# Patient Record
Sex: Male | Born: 1985 | Race: Black or African American | Hispanic: No
Health system: Southern US, Community
[De-identification: ages and names within clinical notes are randomized; demographics above are authoritative.]

## PROBLEM LIST (undated history)

## (undated) DIAGNOSIS — J45909 Unspecified asthma, uncomplicated: Secondary | ICD-10-CM

---

## 2012-09-20 ENCOUNTER — Encounter (HOSPITAL_COMMUNITY): Payer: Self-pay

## 2012-09-20 ENCOUNTER — Emergency Department (HOSPITAL_COMMUNITY)
Admission: EM | Admit: 2012-09-20 | Discharge: 2012-09-20 | Disposition: A | Discharge status: Home / Self Care | Payer: Self-pay | Attending: Emergency Medicine | Admitting: Emergency Medicine

## 2012-09-20 DIAGNOSIS — Z23 Encounter for immunization: Secondary | ICD-10-CM | POA: Insufficient documentation

## 2012-09-20 DIAGNOSIS — Y9289 Other specified places as the place of occurrence of the external cause: Secondary | ICD-10-CM | POA: Insufficient documentation

## 2012-09-20 DIAGNOSIS — W260XXA Contact with knife, initial encounter: Secondary | ICD-10-CM | POA: Insufficient documentation

## 2012-09-20 DIAGNOSIS — S0181XA Laceration without foreign body of other part of head, initial encounter: Secondary | ICD-10-CM

## 2012-09-20 DIAGNOSIS — S01409A Unspecified open wound of unspecified cheek and temporomandibular area, initial encounter: Secondary | ICD-10-CM | POA: Insufficient documentation

## 2012-09-20 DIAGNOSIS — Y9389 Activity, other specified: Secondary | ICD-10-CM | POA: Insufficient documentation

## 2012-09-20 MED ORDER — TETANUS-DIPHTH-ACELL PERTUSSIS 5-2.5-18.5 LF-MCG/0.5 IM SUSP
0.5000 mL | Freq: Once | INTRAMUSCULAR | Status: AC
  Administered 2012-09-20: 0.5 mL via INTRAMUSCULAR
  Filled 2012-09-20: qty 0.5

## 2012-09-20 NOTE — ED Provider Notes (Signed)
History    This chart was scribed for Capricia Serda (PA) non-physician practitioner working with Hilario Quarry, MD by Sofie Rower, ED Scribe. This patient was seen in room WTR2/WLPT2 and the patient's care was started at 3:40Pm.   CSN: [id number]  Arrival date & time 09/20/12  1526   First MD Initiated Contact with Patient 09/20/12 1540      No chief complaint on file.   (Consider location/radiation/quality/duration/timing/severity/associated sxs/prior treatment) The history is provided by the patient. No language interpreter was used.    Jeremy Mccormick is a 27 y.o. male , with no known medical hx, with a chief complaint of facial laceration, who presents to the Emergency Department complaining of sudden, moderate, laceration located at the left cheek, onset today (09/20/12).  The pt reports he was performing repairs on his kitchen sink, where a nearby knife suddenly slid off the counter, cutting the left side of his face. The pt has applied a bandage dressing which controls the bleeding associated with the laceration. The pt denies experiencing any other injury or LOC.   The pt is unaware of the date of his last tetanus immunization.        No past medical history on file.  No past surgical history on file.  No family history on file.  History  Substance Use Topics  . Smoking status: Not on file  . Smokeless tobacco: Not on file  . Alcohol Use: Not on file      Review of Systems  Skin: Positive for wound.  All other systems reviewed and are negative.    Allergies  Review of patient's allergies indicates no known allergies.  Home Medications  No current outpatient prescriptions on file.  BP 165/94  Pulse 94  Temp(Src) 98.1 F (36.7 C) (Oral)  Resp 20  SpO2 100%  Physical Exam  Nursing note and vitals reviewed. Constitutional: He is oriented to person, place, and time. He appears well-developed and well-nourished. No distress.  HENT:  Head: Normocephalic  and atraumatic.    2 lacerations to left cheek. 3cm , 4cm. Gaping hemostatic. Normal oral mucosa. No lacerations within mouth.  Eyes: EOM are normal.  Neck: Neck supple. No tracheal deviation present.  Cardiovascular: Normal rate.   Pulmonary/Chest: Effort normal. No respiratory distress.  Musculoskeletal: Normal range of motion.  Neurological: He is alert and oriented to person, place, and time.  Skin: Skin is warm and dry. Laceration noted.  Psychiatric: He has a normal mood and affect. His behavior is normal.    ED Course  Procedures (including critical care time)  DIAGNOSTIC STUDIES: Oxygen Saturation is 100% on room air, normal by my interpretation.    COORDINATION OF CARE:  4:14 PM- Treatment plan concerning laceration repair discussed with patient. Pt agrees with treatment.  4:26 PM- Recheck. Treatment plan discussed with patient. Pt agrees with treatment.     LACERATION REPAIR PROCEDURE NOTE The patient's identification was confirmed and consent was obtained. This procedure was performed by Jaynie Crumble (PA) at 4:26 PM. Site: left cheek Sterile procedures observed Anesthetic used (type and amt): lidocaine 2.0 w/epi Suture type/size:prolene 6.0 Length:7cm total # of Sutures: 18 Technique:simple interrupted Complexitycomples Antibx ointment applied Tetanus ordered Site anesthetized, irrigated with NS, explored without evidence of foreign body, wound well approximated, site covered with dry, sterile dressing.  Patient tolerated procedure well without complications. Instructions for care discussed verbally and patient provided with additional written instructions for homecare and f/u.       Labs  Reviewed - No data to display No results found.   1. Laceration of face, initial encounter       MDM  Pt with laceration to the left face. Denies anyone else or himself injuring him. Does not feel unsafe at home. Tetanus updated.no signs of head trauma  otherwise.  Laceration repaired. Home with wound care and suture removal in 5-7 days.     I personally performed the services described in this documentation, which was scribed in my presence. The recorded information has been reviewed and is accurate.    Lottie Mussel, PA-C 09/21/12 [phone]

## 2012-09-20 NOTE — ED Notes (Signed)
He states that as he was affecting repairs in his kitchen, some sharp knives were displaced by his movements under a cabinet, causing them to fall; thereby lac. The left side of his face. He has 2 separate lac. There, neither of which are bleeding at present.

## 2012-09-22 NOTE — ED Provider Notes (Signed)
History/physical exam/procedure(s) were performed by non-physician practitioner and as supervising physician I was immediately available for consultation/collaboration. I have reviewed all notes and am in agreement with care and plan.   Danielys Madry S Rikita Grabert, MD 09/22/12 0110 

## 2013-12-20 ENCOUNTER — Emergency Department (HOSPITAL_COMMUNITY): Payer: Self-pay

## 2013-12-20 ENCOUNTER — Encounter (HOSPITAL_COMMUNITY): Payer: Self-pay | Admitting: Emergency Medicine

## 2013-12-20 ENCOUNTER — Emergency Department (HOSPITAL_COMMUNITY)
Admission: EM | Admit: 2013-12-20 | Discharge: 2013-12-21 | Disposition: A | Discharge status: Home / Self Care | Payer: Self-pay | Attending: Emergency Medicine | Admitting: Emergency Medicine

## 2013-12-20 DIAGNOSIS — R197 Diarrhea, unspecified: Secondary | ICD-10-CM | POA: Insufficient documentation

## 2013-12-20 DIAGNOSIS — R1110 Vomiting, unspecified: Secondary | ICD-10-CM | POA: Insufficient documentation

## 2013-12-20 DIAGNOSIS — J189 Pneumonia, unspecified organism: Secondary | ICD-10-CM

## 2013-12-20 DIAGNOSIS — J159 Unspecified bacterial pneumonia: Secondary | ICD-10-CM | POA: Insufficient documentation

## 2013-12-20 DIAGNOSIS — F17200 Nicotine dependence, unspecified, uncomplicated: Secondary | ICD-10-CM | POA: Insufficient documentation

## 2013-12-20 LAB — COMPREHENSIVE METABOLIC PANEL
ALT: 14 U/L (ref 0–53)
ANION GAP: 12 (ref 5–15)
AST: 25 U/L (ref 0–37)
Albumin: 4 g/dL (ref 3.5–5.2)
Alkaline Phosphatase: 52 U/L (ref 39–117)
BUN: 13 mg/dL (ref 6–23)
CALCIUM: 9.8 mg/dL (ref 8.4–10.5)
CO2: 23 mEq/L (ref 19–32)
CREATININE: 1.31 mg/dL (ref 0.50–1.35)
Chloride: 101 mEq/L (ref 96–112)
GFR calc non Af Amer: 74 mL/min — ABNORMAL LOW (ref >90)
GFR, EST AFRICAN AMERICAN: 86 mL/min — AB (ref >90)
GLUCOSE: 88 mg/dL (ref 70–99)
Potassium: 4.1 mEq/L (ref 3.7–5.3)
SODIUM: 136 meq/L — AB (ref 137–147)
TOTAL PROTEIN: 8.2 g/dL (ref 6.0–8.3)
Total Bilirubin: 1.1 mg/dL (ref 0.3–1.2)

## 2013-12-20 LAB — CBC WITH DIFFERENTIAL/PLATELET
Basophils Absolute: 0 10*3/uL (ref 0.0–0.1)
Basophils Relative: 0 % (ref 0–1)
Eosinophils Absolute: 0.1 10*3/uL (ref 0.0–0.7)
Eosinophils Relative: 1 % (ref 0–5)
HCT: 43.9 % (ref 39.0–52.0)
HEMOGLOBIN: 15 g/dL (ref 13.0–17.0)
LYMPHS ABS: 2.4 10*3/uL (ref 0.7–4.0)
LYMPHS PCT: 19 % (ref 12–46)
MCH: 31.1 pg (ref 26.0–34.0)
MCHC: 34.2 g/dL (ref 30.0–36.0)
MCV: 91.1 fL (ref 78.0–100.0)
MONOS PCT: 8 % (ref 3–12)
Monocytes Absolute: 0.9 10*3/uL (ref 0.1–1.0)
NEUTROS ABS: 9.1 10*3/uL — AB (ref 1.7–7.7)
NEUTROS PCT: 72 % (ref 43–77)
PLATELETS: 210 10*3/uL (ref 150–400)
RBC: 4.82 MIL/uL (ref 4.22–5.81)
RDW: 12.2 % (ref 11.5–15.5)
WBC: 12.5 10*3/uL — AB (ref 4.0–10.5)

## 2013-12-20 MED ORDER — METHYLPREDNISOLONE SODIUM SUCC 125 MG IJ SOLR
125.0000 mg | Freq: Once | INTRAMUSCULAR | Status: AC
  Administered 2013-12-20: 125 mg via INTRAVENOUS
  Filled 2013-12-20: qty 2

## 2013-12-20 MED ORDER — SODIUM CHLORIDE 0.9 % IV BOLUS (SEPSIS)
1000.0000 mL | Freq: Once | INTRAVENOUS | Status: AC
  Administered 2013-12-20: 1000 mL via INTRAVENOUS

## 2013-12-20 MED ORDER — ALBUTEROL SULFATE (2.5 MG/3ML) 0.083% IN NEBU
5.0000 mg | INHALATION_SOLUTION | Freq: Once | RESPIRATORY_TRACT | Status: AC
  Administered 2013-12-20: 5 mg via RESPIRATORY_TRACT
  Filled 2013-12-20: qty 6

## 2013-12-20 MED ORDER — PROMETHAZINE HCL 25 MG/ML IJ SOLN
25.0000 mg | Freq: Once | INTRAMUSCULAR | Status: AC
  Administered 2013-12-20: 25 mg via INTRAVENOUS
  Filled 2013-12-20: qty 1

## 2013-12-20 MED ORDER — AZITHROMYCIN 250 MG PO TABS
500.0000 mg | ORAL_TABLET | Freq: Once | ORAL | Status: AC
  Administered 2013-12-20: 500 mg via ORAL
  Filled 2013-12-20: qty 2

## 2013-12-20 MED ORDER — DEXTROSE 5 % IV SOLN
1.0000 g | Freq: Once | INTRAVENOUS | Status: AC
  Administered 2013-12-20: 1 g via INTRAVENOUS
  Filled 2013-12-20: qty 10

## 2013-12-20 NOTE — ED Notes (Signed)
Pt presents with c/o V/D and URI symptoms. Pt says that it is hard for him to take a deep breath and when he does he starts to cough and vomit. Pt also reporting chills.

## 2013-12-20 NOTE — ED Notes (Signed)
Family at bedside. Took blankets to room.

## 2013-12-20 NOTE — ED Provider Notes (Signed)
CSN: [id number]     Arrival date & time 12/20/13  1901 History   First MD Initiated Contact with Patient 12/20/13 1916     Chief Complaint  Patient presents with  . Emesis  . Diarrhea  . URI     (Consider location/radiation/quality/duration/timing/severity/associated sxs/prior Treatment) HPI 28 year old male who comes in today stating that he feels like he has fluid in his lungs. He states that he has had some cough but is nonproductive. He has had some episodes of feeling hot and sweaty. He has had some nausea with one episode of vomiting after coughing. He states that he has had an episode when he had fluid in his lungs before. He is unable to specify specifically what this will was or if there was a specific medical diagnosis attached to it. History reviewed. No pertinent past medical history. History reviewed. No pertinent past surgical history. No family history on file. History  Substance Use Topics  . Smoking status: Current Every Day Smoker -- 0.50 packs/day for 2 years  . Smokeless tobacco: Not on file  . Alcohol Use: Yes     Comment: rarely     Review of Systems  All other systems reviewed and are negative.     Allergies  Review of patient's allergies indicates no known allergies.  Home Medications   Prior to Admission medications   Medication Sig Start Date End Date Taking? Authorizing Provider  ibuprofen (ADVIL,MOTRIN) 200 MG tablet Take 400 mg by mouth every 6 (six) hours as needed for moderate pain.   Yes Historical Provider, MD   BP 139/90  Pulse 99  Temp(Src) 98.8 F (37.1 C) (Oral)  Resp 24  SpO2 98% Physical Exam  Nursing note and vitals reviewed. Constitutional: He is oriented to person, place, and time. He appears well-developed and well-nourished.  HENT:  Head: Normocephalic and atraumatic.  Right Ear: External ear normal.  Left Ear: External ear normal.  Nose: Nose normal.  Mouth/Throat: Oropharynx is clear and moist.  Eyes: Conjunctivae  and EOM are normal. Pupils are equal, round, and reactive to light.  Neck: Normal range of motion. Neck supple.  Cardiovascular: Normal rate, regular rhythm, normal heart sounds and intact distal pulses.   Pulmonary/Chest: Effort normal and breath sounds normal. No respiratory distress. He has no wheezes. He exhibits no tenderness.  Abdominal: Soft. Bowel sounds are normal. He exhibits no distension and no mass. There is no tenderness. There is no guarding.  Musculoskeletal: Normal range of motion.  Neurological: He is alert and oriented to person, place, and time. He has normal reflexes. He exhibits normal muscle tone. Coordination normal.  Skin: Skin is warm and dry.  Psychiatric: He has a normal mood and affect. His behavior is normal. Judgment and thought content normal.    ED Course  Procedures (including critical care time) Labs Review Labs Reviewed  COMPREHENSIVE METABOLIC PANEL - Abnormal; Notable for the following:    Sodium 136 (*)    GFR calc non Af Amer 74 (*)    GFR calc Af Amer 86 (*)    All other components within normal limits  CBC WITH DIFFERENTIAL  CBC WITH DIFFERENTIAL  RAPID HIV SCREEN [facility])    Imaging Review Dg Chest 2 View  12/20/2013   CLINICAL DATA:  Chest pain and dyspnea.  EXAM: CHEST  2 VIEW  COMPARISON:  None.  FINDINGS: There hazy bilateral pulmonary opacities which appear symmetric. No pneumothorax or pleural effusion is identified. Heart size is normal.  IMPRESSION: Hazy bilateral pulmonary opacities could be due to atypical infection or pneumonitis.   Electronically Signed   By: Drusilla Kannerhomas  Dalessio M.D.   On: 12/20/2013 20:04     EKG Interpretation None      MDM   Final diagnoses:  CAP (community acquired pneumonia)       Hilario Quarryanielle S Antolin Belsito, MD 12/21/13 [phone]

## 2013-12-21 LAB — RAPID HIV SCREEN (WH-MAU): SUDS RAPID HIV SCREEN: NONREACTIVE

## 2013-12-21 MED ORDER — AZITHROMYCIN 250 MG PO TABS: 250.0000 mg | ORAL_TABLET | Freq: Every day | ORAL | Status: DC

## 2013-12-21 MED ORDER — ALBUTEROL SULFATE HFA 108 (90 BASE) MCG/ACT IN AERS
2.0000 | INHALATION_SPRAY | RESPIRATORY_TRACT | Status: DC | PRN
  Administered 2013-12-21: 2 via RESPIRATORY_TRACT
  Filled 2013-12-21: qty 6.7

## 2013-12-21 NOTE — Discharge Instructions (Signed)

## 2014-12-20 ENCOUNTER — Encounter (HOSPITAL_COMMUNITY): Payer: Self-pay | Admitting: Emergency Medicine

## 2014-12-20 ENCOUNTER — Emergency Department (HOSPITAL_COMMUNITY): Payer: Self-pay

## 2014-12-20 ENCOUNTER — Emergency Department (HOSPITAL_COMMUNITY)
Admission: EM | Admit: 2014-12-20 | Discharge: 2014-12-20 | Disposition: A | Discharge status: Home / Self Care | Payer: Self-pay | Attending: Emergency Medicine | Admitting: Emergency Medicine

## 2014-12-20 DIAGNOSIS — J9801 Acute bronchospasm: Secondary | ICD-10-CM | POA: Insufficient documentation

## 2014-12-20 DIAGNOSIS — Z720 Tobacco use: Secondary | ICD-10-CM | POA: Insufficient documentation

## 2014-12-20 MED ORDER — ALBUTEROL SULFATE (2.5 MG/3ML) 0.083% IN NEBU
5.0000 mg | INHALATION_SOLUTION | Freq: Once | RESPIRATORY_TRACT | Status: AC
  Administered 2014-12-20: 5 mg via RESPIRATORY_TRACT
  Filled 2014-12-20: qty 6

## 2014-12-20 MED ORDER — IPRATROPIUM BROMIDE 0.02 % IN SOLN
0.5000 mg | Freq: Once | RESPIRATORY_TRACT | Status: AC
  Administered 2014-12-20: 0.5 mg via RESPIRATORY_TRACT
  Filled 2014-12-20: qty 2.5

## 2014-12-20 MED ORDER — AEROCHAMBER Z-STAT PLUS/MEDIUM MISC
1.0000 | Freq: Once | Status: AC
  Administered 2014-12-20: 1
  Filled 2014-12-20: qty 1

## 2014-12-20 MED ORDER — PREDNISONE 20 MG PO TABS: ORAL_TABLET | ORAL | Status: DC

## 2014-12-20 MED ORDER — PREDNISONE 20 MG PO TABS
60.0000 mg | ORAL_TABLET | Freq: Once | ORAL | Status: AC
Start: 2014-12-20 — End: 2014-12-20
  Administered 2014-12-20: 60 mg via ORAL
  Filled 2014-12-20: qty 3

## 2014-12-20 MED ORDER — ALBUTEROL SULFATE HFA 108 (90 BASE) MCG/ACT IN AERS
2.0000 | INHALATION_SPRAY | RESPIRATORY_TRACT | Status: DC | PRN
  Administered 2014-12-20: 2 via RESPIRATORY_TRACT
  Filled 2014-12-20: qty 6.7

## 2014-12-20 NOTE — ED Notes (Signed)
Bed: WA02 Expected date:  Expected time:  Means of arrival:  Comments: t2

## 2014-12-20 NOTE — ED Notes (Signed)
Pt from home c/o shortness of breath that started 2 weeks ago and got worse tonight. Wheezing all fields.

## 2014-12-20 NOTE — ED Notes (Signed)
MD Knapp at bedside 

## 2014-12-20 NOTE — Discharge Instructions (Signed)
Use the inhaler for wheezing or shortness of breath. Take the prednisone until gone. Recheck if you get a fever, your mucus gets yellow or green, or if you are still coughing in the next 10-14 days or if you are struggling to breathe despite using the inhaler. Talk to your land lord about getting rid of the roaches. You may need to put a plastic cover over your mattress, then put your sheets on top of that.    Asthma Attack Prevention Although there is no way to prevent asthma from starting, you can take steps to control the disease and reduce its symptoms. Learn about your asthma and how to control it. Take an active role to control your asthma by working with your health care provider to create and follow an asthma action plan. An asthma action plan guides you in:  Taking your medicines properly.  Avoiding things that set off your asthma or make your asthma worse (asthma triggers).  Tracking your level of asthma control.  Responding to worsening asthma.  Seeking emergency care when needed. To track your asthma, keep records of your symptoms, check your peak flow number using a handheld device that shows how well air moves out of your lungs (peak flow meter), and get regular asthma checkups.  WHAT ARE SOME WAYS TO PREVENT AN ASTHMA ATTACK?  Take medicines as directed by your health care provider.  Keep track of your asthma symptoms and level of control.  With your health care provider, write a detailed plan for taking medicines and managing an asthma attack. Then be sure to follow your action plan. Asthma is an ongoing condition that needs regular monitoring and treatment.  Identify and avoid asthma triggers. Many outdoor allergens and irritants (such as pollen, mold, cold air, and air pollution) can trigger asthma attacks. Find out what your asthma triggers are and take steps to avoid them.  Monitor your breathing. Learn to recognize warning signs of an attack, such as coughing, wheezing,  or shortness of breath. Your lung function may decrease before you notice any signs or symptoms, so regularly measure and record your peak airflow with a home peak flow meter.  Identify and treat attacks early. If you act quickly, you are less likely to have a severe attack. You will also need less medicine to control your symptoms. When your peak flow measurements decrease and alert you to an upcoming attack, take your medicine as instructed and immediately stop any activity that may have triggered the attack. If your symptoms do not improve, get medical help.  Pay attention to increasing quick-relief inhaler use. If you find yourself relying on your quick-relief inhaler, your asthma is not under control. See your health care provider about adjusting your treatment. WHAT CAN MAKE MY SYMPTOMS WORSE? A number of common things can set off or make your asthma symptoms worse and cause temporary increased inflammation of your airways. Keep track of your asthma symptoms for several weeks, detailing all the environmental and emotional factors that are linked with your asthma. When you have an asthma attack, go back to your asthma diary to see which factor, or combination of factors, might have contributed to it. Once you know what these factors are, you can take steps to control many of them. If you have allergies and asthma, it is important to take asthma prevention steps at home. Minimizing contact with the substance to which you are allergic will help prevent an asthma attack. Some triggers and ways to avoid these  triggers are: Animal Dander:  Some people are allergic to the flakes of skin or dried saliva from animals with fur or feathers.   There is no such thing as a hypoallergenic dog or cat breed. All dogs or cats can cause allergies, even if they don't shed.  Keep these pets out of your home.  If you are not able to keep a pet outdoors, keep the pet out of your bedroom and other sleeping areas at all  times, and keep the door closed.  Remove carpets and furniture covered with cloth from your home. If that is not possible, keep the pet away from fabric-covered furniture and carpets. Dust Mites: Many people with asthma are allergic to dust mites. Dust mites are tiny bugs that are found in every home in mattresses, pillows, carpets, fabric-covered furniture, bedcovers, clothes, stuffed toys, and other fabric-covered items.   Cover your mattress in a special dust-proof cover.  Cover your pillow in a special dust-proof cover, or wash the pillow each week in hot water. Water must be hotter than 130 F (54.4 C) to kill dust mites. Cold or warm water used with detergent and bleach can also be effective.  Wash the sheets and blankets on your bed each week in hot water.  Try not to sleep or lie on cloth-covered cushions.  Call ahead when traveling and ask for a smoke-free hotel room. Bring your own bedding and pillows in case the hotel only supplies feather pillows and down comforters, which may contain dust mites and cause asthma symptoms.  Remove carpets from your bedroom and those laid on concrete, if you can.  Keep stuffed toys out of the bed, or wash the toys weekly in hot water or cooler water with detergent and bleach. Cockroaches: Many people with asthma are allergic to the droppings and remains of cockroaches.   Keep food and garbage in closed containers. Never leave food out.  Use poison baits, traps, powders, gels, or paste (for example, boric acid).  If a spray is used to kill cockroaches, stay out of the room until the odor goes away. Indoor Mold:  Fix leaky faucets, pipes, or other sources of water that have mold around them.  Clean floors and moldy surfaces with a fungicide or diluted bleach.  Avoid using humidifiers, vaporizers, or swamp coolers. These can spread molds through the air. Pollen and Outdoor Mold:  When pollen or mold spore counts are high, try to keep your  windows closed.  Stay indoors with windows closed from late morning to afternoon. Pollen and some mold spore counts are highest at that time.  Ask your health care provider whether you need to take anti-inflammatory medicine or increase your dose of the medicine before your allergy season starts. Other Irritants to Avoid:  Tobacco smoke is an irritant. If you smoke, ask your health care provider how you can quit. Ask family members to quit smoking, too. Do not allow smoking in your home or car.  If possible, do not use a wood-burning stove, kerosene heater, or fireplace. Minimize exposure to all sources of smoke, including incense, candles, fires, and fireworks.  Try to stay away from strong odors and sprays, such as perfume, talcum powder, hair spray, and paints.  Decrease humidity in your home and use an indoor air cleaning device. Reduce indoor humidity to below 60%. Dehumidifiers or central air conditioners can do this.  Decrease house dust exposure by changing furnace and air cooler filters frequently.  Try to have someone  else vacuum for you once or twice a week. Stay out of rooms while they are being vacuumed and for a short while afterward.  If you vacuum, use a dust mask from a hardware store, a double-layered or microfilter vacuum cleaner bag, or a vacuum cleaner with a HEPA filter.  Sulfites in foods and beverages can be irritants. Do not drink beer or wine or eat dried fruit, processed potatoes, or shrimp if they cause asthma symptoms.  Cold air can trigger an asthma attack. Cover your nose and mouth with a scarf on cold or windy days.  Several health conditions can make asthma more difficult to manage, including a runny nose, sinus infections, reflux disease, psychological stress, and sleep apnea. Work with your health care provider to manage these conditions.  Avoid close contact with people who have a respiratory infection such as a cold or the flu, since your asthma  symptoms may get worse if you catch the infection. Wash your hands thoroughly after touching items that may have been handled by people with a respiratory infection.  Get a flu shot every year to protect against the flu virus, which often makes asthma worse for days or weeks. Also get a pneumonia shot if you have not previously had one. Unlike the flu shot, the pneumonia shot does not need to be given yearly. Medicines:  Talk to your health care provider about whether it is safe for you to take aspirin or non-steroidal anti-inflammatory medicines (NSAIDs). In a small number of people with asthma, aspirin and NSAIDs can cause asthma attacks. These medicines must be avoided by people who have known aspirin-sensitive asthma. It is important that people with aspirin-sensitive asthma read labels of all over-the-counter medicines used to treat pain, colds, coughs, and fever.  Beta-blockers and ACE inhibitors are other medicines you should discuss with your health care provider. HOW CAN I FIND OUT WHAT I AM ALLERGIC TO? Ask your asthma health care provider about allergy skin testing or blood testing (the RAST test) to identify the allergens to which you are sensitive. If you are found to have allergies, the most important thing to do is to try to avoid exposure to any allergens that you are sensitive to as much as possible. Other treatments for allergies, such as medicines and allergy shots (immunotherapy) are available.  CAN I EXERCISE? Follow your health care provider's advice regarding asthma treatment before exercising. It is important to maintain a regular exercise program, but vigorous exercise or exercise in cold, humid, or dry environments can cause asthma attacks, especially for those people who have exercise-induced asthma. Document Released: 04/11/2009 Document Revised: 04/28/2013 Document Reviewed: 10/29/2012 [facility] Patient Information 2015 [place], [place]. This information is not intended to  replace advice given to you by your health care provider. Make sure you discuss any questions you have with your health care provider.
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
## 2016-05-28 ENCOUNTER — Emergency Department (HOSPITAL_COMMUNITY): Payer: Self-pay

## 2016-05-28 ENCOUNTER — Emergency Department (HOSPITAL_COMMUNITY)
Admission: EM | Admit: 2016-05-28 | Discharge: 2016-05-28 | Disposition: A | Discharge status: Home / Self Care | Payer: Self-pay | Attending: Emergency Medicine | Admitting: Emergency Medicine

## 2016-05-28 ENCOUNTER — Encounter (HOSPITAL_COMMUNITY): Payer: Self-pay | Admitting: Emergency Medicine

## 2016-05-28 DIAGNOSIS — W010XXA Fall on same level from slipping, tripping and stumbling without subsequent striking against object, initial encounter: Secondary | ICD-10-CM | POA: Insufficient documentation

## 2016-05-28 DIAGNOSIS — Y929 Unspecified place or not applicable: Secondary | ICD-10-CM | POA: Insufficient documentation

## 2016-05-28 DIAGNOSIS — R0781 Pleurodynia: Secondary | ICD-10-CM

## 2016-05-28 DIAGNOSIS — S20312A Abrasion of left front wall of thorax, initial encounter: Secondary | ICD-10-CM | POA: Insufficient documentation

## 2016-05-28 DIAGNOSIS — Y939 Activity, unspecified: Secondary | ICD-10-CM | POA: Insufficient documentation

## 2016-05-28 DIAGNOSIS — F17200 Nicotine dependence, unspecified, uncomplicated: Secondary | ICD-10-CM | POA: Insufficient documentation

## 2016-05-28 DIAGNOSIS — W19XXXA Unspecified fall, initial encounter: Secondary | ICD-10-CM

## 2016-05-28 DIAGNOSIS — Y999 Unspecified external cause status: Secondary | ICD-10-CM | POA: Insufficient documentation

## 2016-05-28 DIAGNOSIS — Z79899 Other long term (current) drug therapy: Secondary | ICD-10-CM | POA: Insufficient documentation

## 2016-05-28 DIAGNOSIS — J45909 Unspecified asthma, uncomplicated: Secondary | ICD-10-CM | POA: Insufficient documentation

## 2016-05-28 HISTORY — DX: Unspecified asthma, uncomplicated: J45.909

## 2016-05-28 MED ORDER — NAPROXEN 500 MG PO TABS
500.0000 mg | ORAL_TABLET | Freq: Once | ORAL | Status: AC
  Administered 2016-05-28: 500 mg via ORAL
  Filled 2016-05-28: qty 1

## 2016-05-28 MED ORDER — NAPROXEN 500 MG PO TABS: 500.0000 mg | ORAL_TABLET | Freq: Two times a day (BID) | ORAL | 0 refills | Status: DC

## 2016-05-28 NOTE — ED Triage Notes (Signed)
Pt from home with complaints of left sided rib pain. Pt fell 2 weeks ago landing on a toy. Pt states he has rib pain from the fall causing it to be difficult for him to take deep breaths. Pt is able to maintain his oxygen saturation at 97%

## 2016-05-28 NOTE — ED Provider Notes (Signed)
[facility]-EMERGENCY DEPT Provider Note   CSN: [id number] Arrival date & time: 05/28/16  0110     History   Chief Complaint Chief Complaint  Patient presents with  . Fall  . Chest Pain    HPI Prescott ParmaMichael Cocke is a 31 y.o. male.  The history is provided by the patient and medical records.  Fall  Associated symptoms include chest pain.  Chest Pain      31 year old male here with left rib pain. Reports he tripped and fell 2 weeks ago and landed on one of his children's toys. States he had a small scrape over his left ribs which is healed up at this time, however he continues having pain. Pain is worse with cough, deep breathing, or movement. He denies any shortness of breath. History taking Tylenol at home for pain without relief.  Past Medical History:  Diagnosis Date  . Asthma     There are no active problems to display for this patient.   History reviewed. No pertinent surgical history.     Home Medications    Prior to Admission medications   Medication Sig Start Date End Date Taking? Authorizing Provider  acetaminophen-codeine (TYLENOL #3) 300-30 MG per tablet Take 1 tablet by mouth once.    Historical Provider, MD  azithromycin (ZITHROMAX Z-PAK) 250 MG tablet Take 1 tablet (250 mg total) by mouth daily. Patient not taking: Reported on 12/20/2014 12/24/13   Margarita Grizzleanielle Ray, MD  naproxen sodium (ANAPROX) 220 MG tablet Take 220 mg by mouth 2 (two) times daily as needed (pain).    Historical Provider, MD  predniSONE (DELTASONE) 20 MG tablet Take 3 po QD x 3d , then 2 po QD x 3d then 1 po QD x 3d 12/20/14   Devoria AlbeIva Knapp, MD    Family History No family history on file.  Social History Social History  Substance Use Topics  . Smoking status: Current Every Day Smoker    Packs/day: 0.50    Years: 2.00  . Smokeless tobacco: Never Used  . Alcohol use Yes     Comment: rarely      Allergies   Patient has no known allergies.   Review of Systems Review of Systems    Cardiovascular: Positive for chest pain.  All other systems reviewed and are negative.    Physical Exam Updated Vital Signs BP 146/92 (BP Location: Left Arm)   Pulse 80   Temp 98.8 F (37.1 C) (Oral)   Resp 16   SpO2 97%   Physical Exam  Constitutional: He is oriented to person, place, and time. He appears well-developed and well-nourished.  HENT:  Head: Normocephalic and atraumatic.  Mouth/Throat: Oropharynx is clear and moist.  Eyes: Conjunctivae and EOM are normal. Pupils are equal, round, and reactive to light.  Neck: Normal range of motion.  Cardiovascular: Normal rate, regular rhythm and normal heart sounds.   Pulmonary/Chest: Effort normal and breath sounds normal. No respiratory distress. He has no wheezes.  Abdominal: Soft. Bowel sounds are normal. There is no tenderness. There is no rebound.  Evidence of old appearing abrasion to left lower lateral ribs; no bruising or gross deformity  Musculoskeletal: Normal range of motion.  Neurological: He is alert and oriented to person, place, and time.  Skin: Skin is warm and dry.  Psychiatric: He has a normal mood and affect.  Nursing note and vitals reviewed.    ED Treatments / Results  Labs (all labs ordered are listed, but only abnormal results are displayed)  Labs Reviewed - No data to display  EKG  EKG Interpretation None       Radiology Dg Ribs Unilateral W/chest Left  Result Date: 05/28/2016 CLINICAL DATA:  Left rib pain.  Patient fell 2 weeks ago. EXAM: LEFT RIBS AND CHEST - 3+ VIEW COMPARISON:  12/20/2014 CXR FINDINGS: No fracture or other bone lesions are seen involving the ribs. A BB was placed over the area pain which projects adjacent to the anterior left eleventh rib. No acute abnormality is seen. There is no evidence of pneumothorax or pleural effusion. Both lungs are clear. Heart size and mediastinal contours are within normal limits. IMPRESSION: No acute displaced rib fracture. Electronically Signed    By: Tollie Eth M.D.   On: 05/28/2016 02:30    Procedures Procedures (including critical care time)  Medications Ordered in ED Medications - No data to display   Initial Impression / Assessment and Plan / ED Course  I have reviewed the triage vital signs and the nursing notes.  Pertinent labs & imaging results that were available during my care of the patient were reviewed by me and considered in my medical decision making (see chart for details).  31 year old male here with left rib pain after fall on toy 2 weeks ago.  No acute deformities noted on exam, evidence of old abrasion.  Lungs clear, no respiratory distress.  Rib films without acute findings.  Will plan to discharge home with supportive care.  Discussed plan with patient, he acknowledged understanding and agreed with plan of care.  Return precautions given for new or worsening symptoms.  Final Clinical Impressions(s) / ED Diagnoses   Final diagnoses:  Fall, initial encounter  Rib pain on left side    New Prescriptions New Prescriptions   NAPROXEN (NAPROSYN) 500 MG TABLET    Take 1 tablet (500 mg total) by mouth 2 (two) times daily with a meal.     Garlon Hatchet, PA-C 05/28/16 0840    Doug Sou, MD 05/28/16 1750

## 2016-05-28 NOTE — Discharge Instructions (Signed)
Take medications as directed for pain. Follow-up with your primary care doctor. Return to the ED for new or worsening symptoms.

## 2016-07-08 ENCOUNTER — Emergency Department (HOSPITAL_COMMUNITY)
Admission: EM | Admit: 2016-07-08 | Discharge: 2016-07-08 | Disposition: A | Discharge status: Home / Self Care | Payer: Self-pay | Attending: Emergency Medicine | Admitting: Emergency Medicine

## 2016-07-08 ENCOUNTER — Emergency Department (HOSPITAL_COMMUNITY): Payer: Self-pay

## 2016-07-08 ENCOUNTER — Encounter (HOSPITAL_COMMUNITY): Payer: Self-pay | Admitting: Emergency Medicine

## 2016-07-08 DIAGNOSIS — F1490 Cocaine use, unspecified, uncomplicated: Secondary | ICD-10-CM | POA: Insufficient documentation

## 2016-07-08 DIAGNOSIS — Z87891 Personal history of nicotine dependence: Secondary | ICD-10-CM | POA: Insufficient documentation

## 2016-07-08 DIAGNOSIS — R0789 Other chest pain: Secondary | ICD-10-CM | POA: Insufficient documentation

## 2016-07-08 DIAGNOSIS — J4521 Mild intermittent asthma with (acute) exacerbation: Secondary | ICD-10-CM | POA: Insufficient documentation

## 2016-07-08 LAB — CBC
HCT: 41.5 % (ref 39.0–52.0)
Hemoglobin: 14.6 g/dL (ref 13.0–17.0)
MCH: 31.5 pg (ref 26.0–34.0)
MCHC: 35.2 g/dL (ref 30.0–36.0)
MCV: 89.6 fL (ref 78.0–100.0)
Platelets: 309 10*3/uL (ref 150–400)
RBC: 4.63 MIL/uL (ref 4.22–5.81)
RDW: 12.6 % (ref 11.5–15.5)
WBC: 11.1 10*3/uL — AB (ref 4.0–10.5)

## 2016-07-08 LAB — BASIC METABOLIC PANEL
Anion gap: 11 (ref 5–15)
BUN: 10 mg/dL (ref 6–20)
CHLORIDE: 109 mmol/L (ref 101–111)
CO2: 21 mmol/L — ABNORMAL LOW (ref 22–32)
Calcium: 9.4 mg/dL (ref 8.9–10.3)
Creatinine, Ser: 1.09 mg/dL (ref 0.61–1.24)
GFR calc Af Amer: >60 mL/min (ref >60)
GFR calc non Af Amer: >60 mL/min (ref >60)
Glucose, Bld: 79 mg/dL (ref 65–99)
POTASSIUM: 3.6 mmol/L (ref 3.5–5.1)
SODIUM: 141 mmol/L (ref 135–145)

## 2016-07-08 LAB — I-STAT TROPONIN, ED
TROPONIN I, POC: 0.15 ng/mL — AB (ref 0.00–0.08)
Troponin i, poc: 0.2 ng/mL (ref 0.00–0.08)

## 2016-07-08 MED ORDER — ALBUTEROL SULFATE (2.5 MG/3ML) 0.083% IN NEBU
5.0000 mg | INHALATION_SOLUTION | Freq: Once | RESPIRATORY_TRACT | Status: AC
  Administered 2016-07-08: 5 mg via RESPIRATORY_TRACT
  Filled 2016-07-08: qty 6

## 2016-07-08 MED ORDER — ALBUTEROL SULFATE HFA 108 (90 BASE) MCG/ACT IN AERS
2.0000 | INHALATION_SPRAY | Freq: Once | RESPIRATORY_TRACT | Status: AC
  Administered 2016-07-08: 2 via RESPIRATORY_TRACT
  Filled 2016-07-08: qty 6.7

## 2016-07-08 NOTE — ED Triage Notes (Signed)
Patient is complaining of shortness of breath. Hx of asthma

## 2016-07-08 NOTE — ED Notes (Signed)
Discharge instructions and follow up care reviewed with patient. Patient verbalized understanding. 

## 2016-07-08 NOTE — ED Notes (Signed)
ED Provider at bedside. 

## 2016-07-08 NOTE — ED Notes (Signed)
Patient transported to X-ray 

## 2016-07-08 NOTE — ED Notes (Signed)
I stat trop. Given to MD and RN.

## 2016-07-08 NOTE — ED Provider Notes (Signed)
[facility]-EMERGENCY DEPT Provider Note   CSN: [id number] Arrival date & time: 07/08/16  0905     History   Chief Complaint Chief Complaint  Patient presents with  . Shortness of Breath  . Chest Pain    HPI Jeremy Mccormick is a 31 y.o. male.  The history is provided by the patient.  Shortness of Breath  This is a recurrent problem. The average episode lasts 3 hours. The problem occurs continuously.The problem has been resolved (following initial breathing treatment from triage). Associated symptoms include rhinorrhea, cough (2 days) and chest pain. Pertinent negatives include no leg swelling. He has tried nothing for the symptoms. The treatment provided no relief. Associated medical issues include asthma. Associated medical issues do not include PE or past MI.  Chest Pain   This is a recurrent (consistent with Asthma attacks) problem. The current episode started 1 to 2 hours ago. The problem occurs constantly. The problem has been resolved (following initial breathing treatment from triage). The pain is present in the substernal region. The quality of the pain is described as pressure-like. The pain does not radiate. Associated symptoms include cough (2 days) and shortness of breath.  Pertinent negatives for past medical history include no COPD, no diabetes, no hyperlipidemia, no hypertension, no MI and no PE.    Past Medical History:  Diagnosis Date  . Asthma     There are no active problems to display for this patient.   History reviewed. No pertinent surgical history.     Home Medications    Prior to Admission medications   Medication Sig Start Date End Date Taking? Authorizing Provider  albuterol (PROVENTIL HFA;VENTOLIN HFA) 108 (90 Base) MCG/ACT inhaler Inhale 1-2 puffs into the lungs every 6 (six) hours as needed for wheezing or shortness of breath.   Yes Historical Provider, MD  diphenhydrAMINE (BENADRYL) 25 MG tablet Take 25 mg by mouth every 6 (six) hours as needed for  allergies.   Yes Historical Provider, MD  azithromycin (ZITHROMAX Z-PAK) 250 MG tablet Take 1 tablet (250 mg total) by mouth daily. Patient not taking: Reported on 07/08/2016 12/24/13   [name], MD  naproxen (NAPROSYN) 500 MG tablet Take 1 tablet (500 mg total) by mouth 2 (two) times daily with a meal. Patient not taking: Reported on 07/08/2016 05/28/16   Garlon Hatchet, PA-C  predniSONE (DELTASONE) 20 MG tablet Take 3 po QD x 3d , then 2 po QD x 3d then 1 po QD x 3d Patient not taking: Reported on 07/08/2016 12/20/14   Devoria Albe, MD    Family History No family history on file.  Social History Social History  Substance Use Topics  . Smoking status: Former Smoker    Packs/day: 0.50    Years: 2.00  . Smokeless tobacco: Never Used  . Alcohol use Yes     Comment: rarely      Allergies   Patient has no known allergies.   Review of Systems Review of Systems  HENT: Positive for rhinorrhea.   Respiratory: Positive for cough (2 days) and shortness of breath.   Cardiovascular: Positive for chest pain. Negative for leg swelling.  Ten systems are reviewed and are negative for acute change except as noted in the HPI    Physical Exam Updated Vital Signs BP 130/82 (BP Location: Left Arm)   Pulse 114   Temp 98 F (36.7 C) (Axillary)   Resp (!) 30   Ht 5\' 7"  (1.702 m)   Wt 140 lb (  63.5 kg)   SpO2 98%   BMI 21.93 kg/m   Physical Exam  Constitutional: He is oriented to person, place, and time. He appears well-developed and well-nourished. No distress.  HENT:  Head: Normocephalic and atraumatic.  Nose: Nose normal.  Eyes: Conjunctivae and EOM are normal. Pupils are equal, round, and reactive to light. Right eye exhibits no discharge. Left eye exhibits no discharge. No scleral icterus.  Neck: Normal range of motion. Neck supple.  Cardiovascular: Normal rate and regular rhythm.  Exam reveals no gallop and no friction rub.   No murmur heard. Pulmonary/Chest: Effort normal and breath  sounds normal. No stridor. No respiratory distress. He has no rales.  Abdominal: Soft. He exhibits no distension. There is no tenderness.  Musculoskeletal: He exhibits no edema or tenderness.  Neurological: He is alert and oriented to person, place, and time.  Skin: Skin is warm and dry. No rash noted. He is not diaphoretic. No erythema.  Psychiatric: He has a normal mood and affect.  Vitals reviewed.    ED Treatments / Results  Labs (all labs ordered are listed, but only abnormal results are displayed) Labs Reviewed  BASIC METABOLIC PANEL - Abnormal; Notable for the following:       Result Value   CO2 21 (*)    All other components within normal limits  CBC - Abnormal; Notable for the following:    WBC 11.1 (*)    All other components within normal limits  I-STAT TROPOININ, ED - Abnormal; Notable for the following:    Troponin i, poc 0.20 (*)    All other components within normal limits  I-STAT TROPOININ, ED - Abnormal; Notable for the following:    Troponin i, poc 0.15 (*)    All other components within normal limits    EKG  EKG Interpretation  Date/Time:  Sunday July 08 2016 09:38:10 EST Ventricular Rate:  99 PR Interval:    QRS Duration: 87 QT Interval:  330 QTC Calculation: 424 R Axis:   90 Text Interpretation:  Sinus rhythm Probable left atrial enlargement Borderline right axis deviation ST elev, probable normal early repol pattern Confirmed by [facility] MD, Elainah Rhyne ([id number]) on 07/08/2016 11:12:20 AM       Radiology Dg Chest 2 View  Result Date: 07/08/2016 CLINICAL DATA:  Shortness of breath. EXAM: CHEST  2 VIEW COMPARISON:  Radiographs of May 28, 2016. FINDINGS: The heart size and mediastinal contours are within normal limits. Both lungs are clear. No pneumothorax or pleural effusion is noted. The visualized skeletal structures are unremarkable. IMPRESSION: No active cardiopulmonary disease. Electronically Signed   By: Lupita Raider, M.D.   On: 07/08/2016 10:23     Procedures Procedures (including critical care time)  Medications Ordered in ED Medications  albuterol (PROVENTIL) (2.5 MG/3ML) 0.083% nebulizer solution 5 mg (5 mg Nebulization Given 07/08/16 0931)  albuterol (PROVENTIL HFA;VENTOLIN HFA) 108 (90 Base) MCG/ACT inhaler 2 puff (2 puffs Inhalation Given 07/08/16 1258)     Initial Impression / Assessment and Plan / ED Course  I have reviewed the triage vital signs and the nursing notes.  Pertinent labs & imaging results that were available during my care of the patient were reviewed by me and considered in my medical decision making (see chart for details).     Asthma exacerbation. Improved with single breathing treatment. Chest pain associated with asthma. Low suspicion for ACS. EKG with STE due to repol changes. Currently pain free following breathing treatment. Triage labs with elevated  trop. Pt reported cocaine use. Could be related to this. Low pretest prob for PE. BP not significantly elevated. Equal radial pulses. Chest x-ray without evidence suggestive of pneumonia, pneumothorax, pneumomediastinum.  No abnormal contour of the mediastinum to suggest dissection. No evidence of acute injuries. Low suspicion for dissection. Not classic for esophageal perforation.  Will repeat trop to ensure down trend.   Trop at 3 hour trending down. Has remained HDS throughout stay. The patient is safe for discharge with strict return precautions.    Final Clinical Impressions(s) / ED Diagnoses   Final diagnoses:  Mild intermittent asthma with exacerbation  Atypical chest pain  Cocaine use   Disposition: Discharge  Condition: Good  I have discussed the results, Dx and Tx plan with the patient who expressed understanding and agree(s) with the plan. Discharge instructions discussed at great length. The patient was given strict return precautions who verbalized understanding of the instructions. No further questions at time of discharge.    New  Prescriptions   No medications on file    Follow Up: primary care provider         Nira ConnPedro Eduardo Kesleigh Morson, MD 07/08/16 1312

## 2016-10-27 ENCOUNTER — Emergency Department (HOSPITAL_COMMUNITY)
Admission: EM | Admit: 2016-10-27 | Discharge: 2016-10-27 | Disposition: A | Discharge status: Home / Self Care | Payer: Self-pay | Attending: Emergency Medicine | Admitting: Emergency Medicine

## 2016-10-27 ENCOUNTER — Emergency Department (HOSPITAL_COMMUNITY): Payer: Self-pay

## 2016-10-27 ENCOUNTER — Encounter (HOSPITAL_COMMUNITY): Payer: Self-pay | Admitting: Nurse Practitioner

## 2016-10-27 DIAGNOSIS — J4541 Moderate persistent asthma with (acute) exacerbation: Secondary | ICD-10-CM | POA: Insufficient documentation

## 2016-10-27 DIAGNOSIS — Z79899 Other long term (current) drug therapy: Secondary | ICD-10-CM | POA: Insufficient documentation

## 2016-10-27 DIAGNOSIS — Z87891 Personal history of nicotine dependence: Secondary | ICD-10-CM | POA: Insufficient documentation

## 2016-10-27 MED ORDER — IPRATROPIUM BROMIDE 0.02 % IN SOLN
0.5000 mg | Freq: Once | RESPIRATORY_TRACT | Status: AC
  Administered 2016-10-27: 0.5 mg via RESPIRATORY_TRACT
  Filled 2016-10-27: qty 2.5

## 2016-10-27 MED ORDER — ALBUTEROL (5 MG/ML) CONTINUOUS INHALATION SOLN
10.0000 mg/h | INHALATION_SOLUTION | RESPIRATORY_TRACT | Status: DC
  Administered 2016-10-27: 10 mg/h via RESPIRATORY_TRACT
  Filled 2016-10-27: qty 20

## 2016-10-27 MED ORDER — PREDNISONE 20 MG PO TABS: 40.0000 mg | ORAL_TABLET | Freq: Every day | ORAL | 0 refills | Status: DC

## 2016-10-27 MED ORDER — ALBUTEROL SULFATE (2.5 MG/3ML) 0.083% IN NEBU
5.0000 mg | INHALATION_SOLUTION | Freq: Once | RESPIRATORY_TRACT | Status: AC
  Administered 2016-10-27: 5 mg via RESPIRATORY_TRACT
  Filled 2016-10-27: qty 6

## 2016-10-27 MED ORDER — ALBUTEROL SULFATE HFA 108 (90 BASE) MCG/ACT IN AERS
2.0000 | INHALATION_SPRAY | Freq: Once | RESPIRATORY_TRACT | Status: AC
Start: 2016-10-27 — End: 2016-10-27
  Administered 2016-10-27: 2 via RESPIRATORY_TRACT
  Filled 2016-10-27: qty 6.7

## 2016-10-27 MED ORDER — ACETAMINOPHEN 325 MG PO TABS
650.0000 mg | ORAL_TABLET | Freq: Once | ORAL | Status: AC
  Administered 2016-10-27: 650 mg via ORAL
  Filled 2016-10-27: qty 2

## 2016-10-27 MED ORDER — ALBUTEROL SULFATE HFA 108 (90 BASE) MCG/ACT IN AERS: 1.0000 | INHALATION_SPRAY | Freq: Four times a day (QID) | RESPIRATORY_TRACT | 0 refills | Status: DC | PRN

## 2016-10-27 MED ORDER — PREDNISONE 20 MG PO TABS
60.0000 mg | ORAL_TABLET | Freq: Once | ORAL | Status: AC
  Administered 2016-10-27: 60 mg via ORAL
  Filled 2016-10-27: qty 3

## 2016-10-27 NOTE — ED Notes (Signed)
RT paged.

## 2016-10-27 NOTE — ED Provider Notes (Signed)
[facility]-EMERGENCY DEPT Provider Note   CSN: [id number] Arrival date & time: 10/27/16  4098     History   Chief Complaint Chief Complaint  Patient presents with  . Asthma  . Shortness of Breath  . Wheezing    HPI Jeremy Mccormick is a 31 y.o. male.  Jeremy Mccormick is a 31 y.o. Male presents to the emergency department complaining of an asthma exacerbation beginning earlier this morning. Patient reports he ran out of his albuterol inhaler yesterday. He reports he typically uses his albuterol inhaler every other day unless he has exacerbation. He reports he was looking for his inhaler today because he felt like he was having increased wheezing and shortness of breath. He cannot find his inhaler and reports chest tightness and wheezing today. He denies fevers. He denies other complaints.   The history is provided by the patient and medical records. No language interpreter was used.  Asthma  Associated symptoms include shortness of breath. Pertinent negatives include no chest pain, no abdominal pain and no headaches.  Shortness of Breath  Associated symptoms include cough and wheezing. Pertinent negatives include no fever, no headaches, no sore throat, no neck pain, no chest pain, no vomiting, no abdominal pain and no rash. Associated medical issues include asthma.  Wheezing   Associated symptoms include cough. Pertinent negatives include no chest pain, no fever, no abdominal pain, no vomiting, no dysuria, no headaches, no sore throat, no neck pain and no rash. His past medical history is significant for asthma.    Past Medical History:  Diagnosis Date  . Asthma   . Asthma     There are no active problems to display for this patient.   History reviewed. No pertinent surgical history.     Home Medications    Prior to Admission medications   Medication Sig Start Date End Date Taking? Authorizing Provider  albuterol (PROVENTIL HFA;VENTOLIN HFA) 108 (90 Base) MCG/ACT inhaler Inhale  1-2 puffs into the lungs every 6 (six) hours as needed for wheezing or shortness of breath. 10/27/16   Everlene Farrier, PA-C  predniSONE (DELTASONE) 20 MG tablet Take 2 tablets (40 mg total) by mouth daily. 10/27/16   Everlene Farrier, PA-C    Family History No family history on file.  Social History Social History  Substance Use Topics  . Smoking status: Former Smoker    Packs/day: 0.50    Years: 2.00  . Smokeless tobacco: Never Used  . Alcohol use Yes     Comment: rarely      Allergies   Patient has no known allergies.   Review of Systems Review of Systems  Constitutional: Negative for chills and fever.  HENT: Negative for congestion and sore throat.   Eyes: Negative for visual disturbance.  Respiratory: Positive for cough, chest tightness, shortness of breath and wheezing.   Cardiovascular: Negative for chest pain.  Gastrointestinal: Negative for abdominal pain, nausea and vomiting.  Genitourinary: Negative for dysuria.  Musculoskeletal: Negative for neck pain.  Skin: Negative for rash.  Neurological: Negative for light-headedness and headaches.     Physical Exam Updated Vital Signs BP (!) 166/74 (BP Location: Left Arm)   Pulse (!) 111   Temp 98 F (36.7 C) (Oral)   Resp 18   Ht 5\' 7"  (1.702 m)   Wt 63.5 kg (140 lb)   SpO2 100%   BMI 21.93 kg/m   Physical Exam  Constitutional: He is oriented to person, place, and time. He appears well-developed and well-nourished. No  distress.  Nontoxic appearing.  HENT:  Head: Normocephalic and atraumatic.  Mouth/Throat: Oropharynx is clear and moist.  Eyes: Conjunctivae are normal. Pupils are equal, round, and reactive to light. Right eye exhibits no discharge. Left eye exhibits no discharge.  Neck: Neck supple. No JVD present.  Cardiovascular: Normal rate, regular rhythm, normal heart sounds and intact distal pulses.  Exam reveals no gallop and no friction rub.   No murmur heard. Heart rate is 96 on exam.    Pulmonary/Chest: Effort normal. No stridor. No respiratory distress. He has wheezes. He has no rales.  Inspiratory and expiratory wheezes noted bilaterally. No rales or rhonchi. No increased work of breathing. Oxygen saturation is 96% on room air. Speaking in complete sentences.  Abdominal: Soft. There is no tenderness.  Musculoskeletal: He exhibits no edema.  Lymphadenopathy:    He has no cervical adenopathy.  Neurological: He is alert and oriented to person, place, and time. Coordination normal.  Skin: Skin is warm and dry. Capillary refill takes less than 2 seconds. No rash noted. He is not diaphoretic. No erythema. No pallor.  Psychiatric: He has a normal mood and affect. His behavior is normal.  Nursing note and vitals reviewed.    ED Treatments / Results  Labs (all labs ordered are listed, but only abnormal results are displayed) Labs Reviewed - No data to display  EKG  ECG interpretation   Date: 10/27/2016  Rate: 93  Rhythm: normal sinus rhythm  QRS Axis: normal  Intervals: normal  ST/T Wave abnormalities: normal  Conduction Disutrbances: none          Radiology No results found.  Procedures Procedures (including critical care time)  Medications Ordered in ED Medications  albuterol (PROVENTIL,VENTOLIN) solution continuous neb (0 mg/hr Nebulization Stopped 10/27/16 0950)  albuterol (PROVENTIL HFA;VENTOLIN HFA) 108 (90 Base) MCG/ACT inhaler 2 puff (not administered)  albuterol (PROVENTIL) (2.5 MG/3ML) 0.083% nebulizer solution 5 mg (5 mg Nebulization Given 10/27/16 0622)  albuterol (PROVENTIL) (2.5 MG/3ML) 0.083% nebulizer solution 5 mg (5 mg Nebulization Given 10/27/16 0711)  ipratropium (ATROVENT) nebulizer solution 0.5 mg (0.5 mg Nebulization Given 10/27/16 0711)  predniSONE (DELTASONE) tablet 60 mg (60 mg Oral Given 10/27/16 0646)  acetaminophen (TYLENOL) tablet 650 mg (650 mg Oral Given 10/27/16 0646)  ipratropium (ATROVENT) nebulizer solution 0.5 mg (0.5 mg  Nebulization Given 10/27/16 0815)     Initial Impression / Assessment and Plan / ED Course  I have reviewed the triage vital signs and the nursing notes.  Pertinent labs & imaging results that were available during my care of the patient were reviewed by me and considered in my medical decision making (see chart for details).    This is a 31 y.o. Male presents to the emergency department complaining of an asthma exacerbation beginning earlier this morning. Patient reports he ran out of his albuterol inhaler yesterday. He reports he typically uses his albuterol inhaler every other day unless he has exacerbation. He reports he was looking for his inhaler today because he felt like he was having increased wheezing and shortness of breath. On exam the patient is afebrile and nontoxic appearing. He has inspiratory and expiratory wheezes noted bilaterally. He is speaking in complete sentences. Will provide with albuterol and Atrovent breathing treatment, 60 of prednisone and reevaluate. At reevaluation patient is still having wheezing. He reports starting to feel better but still feels some shortness of breath. Will provide with continuous breathing treatment, which will be his 3rd treatment.  At reevaluation after continuous  neb patient still has some wheezing, but reports his SOB has resolved. I find him lying down flat on his bed sleeping. He reports feeling much better and is comfortable going home. He declines additional breathing treatments at this time. Will provide with albuterol inhaler here. We'll discharge with a course of oral steroids. I encouraged him to follow-up with primary care for possible inhaled corticosteroid. I discussed strict and specific return precautions with the patient. I advised the patient to follow-up with their primary care provider this week. I advised the patient to return to the emergency department with new or worsening symptoms or new concerns. The patient verbalized  understanding and agreement with plan.    This patient was discussed with and evaluated by Dr. Adriana Simasook who agrees with assessment and plan.   Final Clinical Impressions(s) / ED Diagnoses   Final diagnoses:  Moderate persistent asthma with exacerbation    New Prescriptions New Prescriptions   ALBUTEROL (PROVENTIL HFA;VENTOLIN HFA) 108 (90 BASE) MCG/ACT INHALER    Inhale 1-2 puffs into the lungs every 6 (six) hours as needed for wheezing or shortness of breath.   PREDNISONE (DELTASONE) 20 MG TABLET    Take 2 tablets (40 mg total) by mouth daily.     Everlene FarrierDansie, Auriah Hollings, PA-C 10/27/16 1119    Donnetta Hutchingook, Brian, MD 10/28/16 [phone]

## 2016-10-27 NOTE — ED Notes (Signed)
ED Provider at bedside. 

## 2016-10-27 NOTE — ED Triage Notes (Signed)
Patient presents with centralized chest pain and shortness of breath. States he thinks he is having an asthma attack. Speaks in short sentences due to shortness of breath. States he ran out of inhaler and has no insurance to see the doctor or get another one. Denies fever. Reports chest tightness and wheezing. Reports this started about 530 this morning and it woke him out of his sleep.

## 2016-12-29 ENCOUNTER — Emergency Department (HOSPITAL_COMMUNITY): Admission: EM | Admit: 2016-12-29 | Discharge: 2016-12-29 | Discharge status: Against Medical Advice | Payer: Self-pay

## 2016-12-29 NOTE — ED Notes (Signed)
Called for Pt x3 in lobby no response.

## 2016-12-29 NOTE — ED Triage Notes (Signed)
Pt called for triage, no response. 

## 2016-12-29 NOTE — ED Triage Notes (Signed)
Pt called for room for second time, no response

## 2017-01-25 ENCOUNTER — Encounter (HOSPITAL_COMMUNITY): Payer: Self-pay

## 2017-01-25 ENCOUNTER — Emergency Department (HOSPITAL_COMMUNITY)
Admission: EM | Admit: 2017-01-25 | Discharge: 2017-01-25 | Disposition: A | Discharge status: Home / Self Care | Payer: Self-pay | Attending: Emergency Medicine | Admitting: Emergency Medicine

## 2017-01-25 ENCOUNTER — Emergency Department (HOSPITAL_COMMUNITY): Payer: Self-pay

## 2017-01-25 DIAGNOSIS — J4551 Severe persistent asthma with (acute) exacerbation: Secondary | ICD-10-CM | POA: Insufficient documentation

## 2017-01-25 DIAGNOSIS — J069 Acute upper respiratory infection, unspecified: Secondary | ICD-10-CM | POA: Insufficient documentation

## 2017-01-25 DIAGNOSIS — B9789 Other viral agents as the cause of diseases classified elsewhere: Secondary | ICD-10-CM | POA: Insufficient documentation

## 2017-01-25 LAB — COMPREHENSIVE METABOLIC PANEL
ALT: 30 U/L (ref 17–63)
AST: 32 U/L (ref 15–41)
Albumin: 4.6 g/dL (ref 3.5–5.0)
Alkaline Phosphatase: 71 U/L (ref 38–126)
Anion gap: 11 (ref 5–15)
BILIRUBIN TOTAL: 1.1 mg/dL (ref 0.3–1.2)
BUN: 13 mg/dL (ref 6–20)
CALCIUM: 9.5 mg/dL (ref 8.9–10.3)
CHLORIDE: 106 mmol/L (ref 101–111)
CO2: 23 mmol/L (ref 22–32)
CREATININE: 1.39 mg/dL — AB (ref 0.61–1.24)
Glucose, Bld: 95 mg/dL (ref 65–99)
Potassium: 3.7 mmol/L (ref 3.5–5.1)
Sodium: 140 mmol/L (ref 135–145)
TOTAL PROTEIN: 8.1 g/dL (ref 6.5–8.1)

## 2017-01-25 LAB — CBC WITH DIFFERENTIAL/PLATELET
Basophils Absolute: 0 10*3/uL (ref 0.0–0.1)
Basophils Relative: 0 %
EOS PCT: 5 %
Eosinophils Absolute: 0.5 10*3/uL (ref 0.0–0.7)
HEMATOCRIT: 46.6 % (ref 39.0–52.0)
Hemoglobin: 15.9 g/dL (ref 13.0–17.0)
LYMPHS ABS: 2.2 10*3/uL (ref 0.7–4.0)
LYMPHS PCT: 18 %
MCH: 32.2 pg (ref 26.0–34.0)
MCHC: 34.1 g/dL (ref 30.0–36.0)
MCV: 94.3 fL (ref 78.0–100.0)
MONO ABS: 0.8 10*3/uL (ref 0.1–1.0)
Monocytes Relative: 7 %
NEUTROS ABS: 8.3 10*3/uL — AB (ref 1.7–7.7)
Neutrophils Relative %: 70 %
PLATELETS: 243 10*3/uL (ref 150–400)
RBC: 4.94 MIL/uL (ref 4.22–5.81)
RDW: 12.6 % (ref 11.5–15.5)
WBC: 11.8 10*3/uL — ABNORMAL HIGH (ref 4.0–10.5)

## 2017-01-25 MED ORDER — IPRATROPIUM BROMIDE 0.02 % IN SOLN
0.5000 mg | Freq: Once | RESPIRATORY_TRACT | Status: AC
  Administered 2017-01-25: 0.5 mg via RESPIRATORY_TRACT
  Filled 2017-01-25: qty 2.5

## 2017-01-25 MED ORDER — ACETAMINOPHEN 500 MG PO TABS: 1000.0000 mg | ORAL_TABLET | Freq: Three times a day (TID) | ORAL | 0 refills | Status: AC | PRN

## 2017-01-25 MED ORDER — AZITHROMYCIN 250 MG PO TABS: 250.0000 mg | ORAL_TABLET | Freq: Every day | ORAL | 0 refills | Status: DC

## 2017-01-25 MED ORDER — ALBUTEROL (5 MG/ML) CONTINUOUS INHALATION SOLN
10.0000 mg | INHALATION_SOLUTION | RESPIRATORY_TRACT | Status: AC
  Administered 2017-01-25: 10 mg via RESPIRATORY_TRACT
  Filled 2017-01-25: qty 20

## 2017-01-25 MED ORDER — MAGNESIUM SULFATE 2 GM/50ML IV SOLN
2.0000 g | INTRAVENOUS | Status: AC
  Administered 2017-01-25: 2 g via INTRAVENOUS
  Filled 2017-01-25: qty 50

## 2017-01-25 MED ORDER — ALBUTEROL SULFATE (2.5 MG/3ML) 0.083% IN NEBU
5.0000 mg | INHALATION_SOLUTION | Freq: Once | RESPIRATORY_TRACT | Status: AC
  Administered 2017-01-25: 5 mg via RESPIRATORY_TRACT
  Filled 2017-01-25: qty 6

## 2017-01-25 MED ORDER — IBUPROFEN 800 MG PO TABS
800.0000 mg | ORAL_TABLET | Freq: Once | ORAL | Status: AC
  Administered 2017-01-25: 800 mg via ORAL
  Filled 2017-01-25: qty 1

## 2017-01-25 MED ORDER — BENZONATATE 100 MG PO CAPS: 100.0000 mg | ORAL_CAPSULE | Freq: Three times a day (TID) | ORAL | 0 refills | Status: DC | PRN

## 2017-01-25 MED ORDER — ALBUTEROL SULFATE HFA 108 (90 BASE) MCG/ACT IN AERS: 1.0000 | INHALATION_SPRAY | Freq: Four times a day (QID) | RESPIRATORY_TRACT | 0 refills | Status: DC | PRN

## 2017-01-25 MED ORDER — LORATADINE 10 MG PO TABS
10.0000 mg | ORAL_TABLET | Freq: Once | ORAL | Status: AC
  Administered 2017-01-25: 10 mg via ORAL
  Filled 2017-01-25: qty 1

## 2017-01-25 MED ORDER — SODIUM CHLORIDE 0.9 % IV BOLUS (SEPSIS)
1000.0000 mL | Freq: Once | INTRAVENOUS | Status: AC
  Administered 2017-01-25: 1000 mL via INTRAVENOUS

## 2017-01-25 MED ORDER — PREDNISONE 20 MG PO TABS: 40.0000 mg | ORAL_TABLET | Freq: Every day | ORAL | 0 refills | Status: DC

## 2017-01-25 MED ORDER — ACETAMINOPHEN 500 MG PO TABS
1000.0000 mg | ORAL_TABLET | Freq: Once | ORAL | Status: AC
  Administered 2017-01-25: 1000 mg via ORAL
  Filled 2017-01-25: qty 2

## 2017-01-25 MED ORDER — METHYLPREDNISOLONE SODIUM SUCC 125 MG IJ SOLR
125.0000 mg | Freq: Once | INTRAMUSCULAR | Status: AC
  Administered 2017-01-25: 125 mg via INTRAVENOUS
  Filled 2017-01-25: qty 2

## 2017-01-25 MED ORDER — ALBUTEROL SULFATE HFA 108 (90 BASE) MCG/ACT IN AERS
2.0000 | INHALATION_SPRAY | Freq: Once | RESPIRATORY_TRACT | Status: AC
  Administered 2017-01-25: 2 via RESPIRATORY_TRACT
  Filled 2017-01-25: qty 6.7

## 2017-01-25 NOTE — ED Notes (Signed)
Checked pt's pulse oximetry while ambulating. O2 stayed at 94%.

## 2017-01-25 NOTE — ED Provider Notes (Signed)
[facility]-EMERGENCY DEPT Provider Note   CSN: [id number] Arrival date & time: 01/25/17  0205     History   Chief Complaint Chief Complaint  Patient presents with  . asthma    HPI Jeremy Mccormick is a 31 y.o. male.  31 year old male with a history of asthma presents to the emergency department for shortness of breath. Patient reports increasing shortness of breath since yesterday with cough beginning yesterday morning. He has had wheezing which she has tried to manage with his home inhaler, but this has provided no relief. Symptoms preceded by nasal congestion. No vomiting or diarrhea, syncope. The patient was around a sick child this past weekend at a birthday party. No hx of hospitalizations or intubations 2/2 asthma exacerbation.      Past Medical History:  Diagnosis Date  . Asthma   . Asthma     There are no active problems to display for this patient.   History reviewed. No pertinent surgical history.     Home Medications    Prior to Admission medications   Medication Sig Start Date End Date Taking? Authorizing Provider  acetaminophen (TYLENOL) 500 MG tablet Take 2 tablets (1,000 mg total) by mouth every 8 (eight) hours as needed for moderate pain or fever. 01/25/17   Antony Madura, PA-C  albuterol (PROVENTIL HFA;VENTOLIN HFA) 108 (90 Base) MCG/ACT inhaler Inhale 1-2 puffs into the lungs every 6 (six) hours as needed for wheezing or shortness of breath. 01/25/17   Antony Madura, PA-C  azithromycin (ZITHROMAX) 250 MG tablet Take 1 tablet (250 mg total) by mouth daily. Take first 2 tablets together, then 1 every day until finished. 01/25/17   Antony Madura, PA-C  benzonatate (TESSALON) 100 MG capsule Take 1 capsule (100 mg total) by mouth 3 (three) times daily as needed for cough. 01/25/17   Antony Madura, PA-C  predniSONE (DELTASONE) 20 MG tablet Take 2 tablets (40 mg total) by mouth daily. Take 40 mg by mouth daily for 3 days, then  by mouth daily for 3 days, then  daily for  3 days 01/25/17   Antony Madura, PA-C    Family History History reviewed. No pertinent family history.  Social History Social History  Substance Use Topics  . Smoking status: Former Smoker    Packs/day: 0.50    Years: 2.00  . Smokeless tobacco: Never Used  . Alcohol use Yes     Comment: rarely      Allergies   Patient has no known allergies.   Review of Systems Review of Systems Ten systems reviewed and are negative for acute change, except as noted in the HPI.    Physical Exam Updated Vital Signs BP 110/62   Pulse (!) 115   Temp 98.2 F (36.8 C) (Oral)   Resp (!) 26   SpO2 91%   Physical Exam  Constitutional: He is oriented to person, place, and time. He appears well-developed and well-nourished. He appears distressed (mild).  HENT:  Head: Normocephalic and atraumatic.  Eyes: Conjunctivae and EOM are normal. No scleral icterus.  Neck: Normal range of motion.  Cardiovascular: Regular rhythm and intact distal pulses.   Tachycardic to 120-130's  Pulmonary/Chest: Accessory muscle usage present. Tachypnea noted. He is in respiratory distress. He has wheezes. He has no rhonchi. He has no rales.  Diffuse expiratory wheeze.  Musculoskeletal: Normal range of motion.  Neurological: He is alert and oriented to person, place, and time. He exhibits normal muscle tone. Coordination normal.  Skin: Skin is warm and  dry. No rash noted. He is not diaphoretic. No erythema. No pallor.  Psychiatric: His behavior is normal. His mood appears anxious.  Nursing note and vitals reviewed.    ED Treatments / Results  Labs (all labs ordered are listed, but only abnormal results are displayed) Labs Reviewed  CBC WITH DIFFERENTIAL/PLATELET - Abnormal; Notable for the following:       Result Value   WBC 11.8 (*)    Neutro Abs 8.3 (*)    All other components within normal limits  COMPREHENSIVE METABOLIC PANEL - Abnormal; Notable for the following:    Creatinine, Ser 1.39 (*)    All  other components within normal limits    EKG  EKG Interpretation None       Radiology Dg Chest 2 View  Result Date: 01/25/2017 CLINICAL DATA:  Shortness of breath, cough, and central chest pain due to asthma attack. Nausea and vomiting. Occasional smoker. EXAM: CHEST  2 VIEW COMPARISON:  07/08/2016 FINDINGS: Mild hyperinflation. The heart size and mediastinal contours are within normal limits. Both lungs are clear. The visualized skeletal structures are unremarkable. IMPRESSION: No active cardiopulmonary disease. Electronically Signed   By: Burman Nieves M.D.   On: 01/25/2017 02:57    Procedures Procedures (including critical care time)  Medications Ordered in ED Medications  albuterol (PROVENTIL,VENTOLIN) solution continuous neb (0 mg Nebulization Stopped 01/25/17 0433)  albuterol (PROVENTIL) (2.5 MG/3ML) 0.083% nebulizer solution 5 mg (not administered)  ipratropium (ATROVENT) nebulizer solution 0.5 mg (not administered)  albuterol (PROVENTIL HFA;VENTOLIN HFA) 108 (90 Base) MCG/ACT inhaler 2 puff (not administered)  albuterol (PROVENTIL) (2.5 MG/3ML) 0.083% nebulizer solution 5 mg (5 mg Nebulization Given 01/25/17 0218)  sodium chloride 0.9 % bolus 1,000 mL (0 mLs Intravenous Stopped 01/25/17 0643)  methylPREDNISolone sodium succinate (SOLU-MEDROL) 125 mg/2 mL injection 125 mg (125 mg Intravenous Given 01/25/17 0326)  albuterol (PROVENTIL) (2.5 MG/3ML) 0.083% nebulizer solution 5 mg (5 mg Nebulization Given 01/25/17 0323)  ipratropium (ATROVENT) nebulizer solution 0.5 mg (0.5 mg Nebulization Given 01/25/17 0323)  magnesium sulfate IVPB 2 g 50 mL (0 g Intravenous Stopped 01/25/17 0617)  acetaminophen (TYLENOL) tablet 1,000 mg (1,000 mg Oral Given 01/25/17 0350)  loratadine (CLARITIN) tablet 10 mg (10 mg Oral Given 01/25/17 0731)  ibuprofen (ADVIL,MOTRIN) tablet 800 mg (800 mg Oral Given 01/25/17 0731)    CRITICAL CARE Performed by: Antony Madura   Total critical care time: 45  minutes  Critical care time was exclusive of separately billable procedures and treating other patients.  Critical care was necessary to treat or prevent imminent or life-threatening deterioration.  Critical care was time spent personally by me on the following activities: development of treatment plan with patient and/or surrogate as well as nursing, discussions with consultants, evaluation of patient's response to treatment, examination of patient, obtaining history from patient or surrogate, ordering and performing treatments and interventions, ordering and review of laboratory studies, ordering and review of radiographic studies, pulse oximetry and re-evaluation of patient's condition.    Initial Impression / Assessment and Plan / ED Course  I have reviewed the triage vital signs and the nursing notes.  Pertinent labs & imaging results that were available during my care of the patient were reviewed by me and considered in my medical decision making (see chart for details).     3:30 AM Patient presenting for worsening shortness of breath. Hx of sick contact exposure with URI symptoms preceding SOB today. He has audible expiratory wheezing on exam. No rales or rhonchi. He  has mild respiratory distress with positive retractions and accessory muscle use. Continuous albuterol treatment following DuoNeb. Patient also to receive Solu-Medrol and IV magnesium. CXR negative for PNA.  4:15 AM Patient reassessed. He is currently on a continuous nebulizer treatment. Persistent wheezing noted, though respiratory distress has subsided. We'll continue to monitor.  6:30 AM Patient with faint expiratory wheezes throughout, but tachypnea and dyspnea have subsided. Patient states his breathing is much improved. Will assess pulse oximetry with ambulation. He has been off CAT for >1 hour.  7:20 AM Per RN, patient ambulated with SpO2 of 94% on room air.  7:30 AM Patient with improved lung sounds on repeat  assessment. There is a faint expiratory wheeze diffusely, though this may be adventitious secondary to smoking history. Will provide one additional DuoNeb prior to discharge. Patient expresses comfort with outpatient management. Patient signed out to [facility], PA-C at shift change. Anticipate discharge upon completion of treatment.   Final Clinical Impressions(s) / ED Diagnoses   Final diagnoses:  Viral URI with cough  Severe persistent asthma with acute exacerbation    New Prescriptions New Prescriptions   AZITHROMYCIN (ZITHROMAX) 250 MG TABLET    Take 1 tablet (250 mg total) by mouth daily. Take first 2 tablets together, then 1 every day until finished.   BENZONATATE (TESSALON) 100 MG CAPSULE    Take 1 capsule (100 mg total) by mouth 3 (three) times daily as needed for cough.   PREDNISONE (DELTASONE) 20 MG TABLET    Take 2 tablets (40 mg total) by mouth daily. Take 40 mg by mouth daily for 3 days, then  by mouth daily for 3 days, then  daily for 3 days     Antony Madura, Cordelia Poche 01/25/17 0737    Palumbo, April, MD 01/25/17 [phone]

## 2017-01-25 NOTE — ED Triage Notes (Signed)
Pt complains of being short of breath and coughing since this am, pt has asthma, no relief with inhaler

## 2017-01-25 NOTE — ED Provider Notes (Signed)
  Physical Exam  BP (!) 117/42   Pulse (!) 106   Temp 98.2 F (36.8 C) (Oral)   Resp (!) 27   SpO2 95%   Physical Exam  Gen: well appearing, in NAD CV: tachycardic, reg rhythm.  Pulm: good air movement in all lung fields, intermittent scattered wheezes Abd: soft, nontender  Skin: MM moist. No rashes  ED Course  Procedures  MDM Patient signed out to me by Carmie Kanner, PA-C. Please see previous notes for history and initial workup.  Patient signed out to me while finishing his last neb treatment. Treatment plan previously discussed with patient. On examination, patient lung sounds improved. Patient appears stable and safe for discharge. Slight tachycardia likely due to multiple albuterol treatments. Discussed plan with patient. Patient has no questions at this time. Return precautions given. Patient states he understands and agrees to plan.       Alveria Apley, PA-C 01/25/17 1618    Raeford Razor, MD 01/26/17 [phone]

## 2017-01-25 NOTE — Discharge Instructions (Signed)
Take prednisone and Azithromycin as prescribed until finished. Use 2 puffs of an albuterol inhaler every 6 hours for wheezing or shortness of breath. Take Tessalon for cough and Tylenol every 8 hours for fever. Follow up with a primary care doctor to ensure resolution of symptoms. Stop smoking.

## 2017-09-09 ENCOUNTER — Emergency Department (HOSPITAL_COMMUNITY): Payer: Self-pay

## 2017-09-09 ENCOUNTER — Inpatient Hospital Stay (HOSPITAL_COMMUNITY): Payer: Self-pay

## 2017-09-09 ENCOUNTER — Inpatient Hospital Stay (HOSPITAL_COMMUNITY)
Admission: EM | Admit: 2017-09-09 | Discharge: 2017-09-19 | DRG: 207 | Discharge status: Against Medical Advice | Payer: Self-pay | Attending: Internal Medicine | Admitting: Internal Medicine

## 2017-09-09 ENCOUNTER — Encounter (HOSPITAL_COMMUNITY): Payer: Self-pay

## 2017-09-09 ENCOUNTER — Other Ambulatory Visit: Payer: Self-pay

## 2017-09-09 DIAGNOSIS — R1110 Vomiting, unspecified: Secondary | ICD-10-CM

## 2017-09-09 DIAGNOSIS — J45901 Unspecified asthma with (acute) exacerbation: Secondary | ICD-10-CM | POA: Diagnosis present

## 2017-09-09 DIAGNOSIS — J4552 Severe persistent asthma with status asthmaticus: Secondary | ICD-10-CM | POA: Diagnosis present

## 2017-09-09 DIAGNOSIS — F10239 Alcohol dependence with withdrawal, unspecified: Secondary | ICD-10-CM | POA: Diagnosis not present

## 2017-09-09 DIAGNOSIS — J9602 Acute respiratory failure with hypercapnia: Secondary | ICD-10-CM | POA: Diagnosis present

## 2017-09-09 DIAGNOSIS — R740 Nonspecific elevation of levels of transaminase and lactic acid dehydrogenase [LDH]: Secondary | ICD-10-CM | POA: Diagnosis present

## 2017-09-09 DIAGNOSIS — J15211 Pneumonia due to Methicillin susceptible Staphylococcus aureus: Principal | ICD-10-CM | POA: Diagnosis present

## 2017-09-09 DIAGNOSIS — N179 Acute kidney failure, unspecified: Secondary | ICD-10-CM | POA: Diagnosis present

## 2017-09-09 DIAGNOSIS — R451 Restlessness and agitation: Secondary | ICD-10-CM | POA: Diagnosis not present

## 2017-09-09 DIAGNOSIS — R0989 Other specified symptoms and signs involving the circulatory and respiratory systems: Secondary | ICD-10-CM

## 2017-09-09 DIAGNOSIS — E876 Hypokalemia: Secondary | ICD-10-CM | POA: Diagnosis present

## 2017-09-09 DIAGNOSIS — Z4659 Encounter for fitting and adjustment of other gastrointestinal appliance and device: Secondary | ICD-10-CM

## 2017-09-09 DIAGNOSIS — J9601 Acute respiratory failure with hypoxia: Secondary | ICD-10-CM | POA: Diagnosis present

## 2017-09-09 DIAGNOSIS — F1423 Cocaine dependence with withdrawal: Secondary | ICD-10-CM | POA: Diagnosis not present

## 2017-09-09 DIAGNOSIS — G9341 Metabolic encephalopathy: Secondary | ICD-10-CM | POA: Diagnosis present

## 2017-09-09 DIAGNOSIS — R001 Bradycardia, unspecified: Secondary | ICD-10-CM | POA: Diagnosis not present

## 2017-09-09 DIAGNOSIS — F10939 Alcohol use, unspecified with withdrawal, unspecified: Secondary | ICD-10-CM | POA: Diagnosis present

## 2017-09-09 DIAGNOSIS — Z87891 Personal history of nicotine dependence: Secondary | ICD-10-CM

## 2017-09-09 DIAGNOSIS — R0603 Acute respiratory distress: Secondary | ICD-10-CM

## 2017-09-09 DIAGNOSIS — M6282 Rhabdomyolysis: Secondary | ICD-10-CM | POA: Diagnosis present

## 2017-09-09 DIAGNOSIS — E872 Acidosis: Secondary | ICD-10-CM | POA: Diagnosis not present

## 2017-09-09 DIAGNOSIS — J189 Pneumonia, unspecified organism: Secondary | ICD-10-CM

## 2017-09-09 DIAGNOSIS — Z978 Presence of other specified devices: Secondary | ICD-10-CM

## 2017-09-09 DIAGNOSIS — R739 Hyperglycemia, unspecified: Secondary | ICD-10-CM | POA: Diagnosis present

## 2017-09-09 DIAGNOSIS — R069 Unspecified abnormalities of breathing: Secondary | ICD-10-CM

## 2017-09-09 DIAGNOSIS — T380X5A Adverse effect of glucocorticoids and synthetic analogues, initial encounter: Secondary | ICD-10-CM | POA: Diagnosis present

## 2017-09-09 DIAGNOSIS — R7401 Elevation of levels of liver transaminase levels: Secondary | ICD-10-CM | POA: Diagnosis present

## 2017-09-09 DIAGNOSIS — J45902 Unspecified asthma with status asthmaticus: Secondary | ICD-10-CM

## 2017-09-09 DIAGNOSIS — Z9289 Personal history of other medical treatment: Secondary | ICD-10-CM

## 2017-09-09 DIAGNOSIS — Z7951 Long term (current) use of inhaled steroids: Secondary | ICD-10-CM

## 2017-09-09 DIAGNOSIS — I10 Essential (primary) hypertension: Secondary | ICD-10-CM | POA: Diagnosis present

## 2017-09-09 LAB — BLOOD GAS, ARTERIAL
Acid-base deficit: 10.4 mmol/L — ABNORMAL HIGH (ref 0.0–2.0)
Acid-base deficit: 9.9 mmol/L — ABNORMAL HIGH (ref 0.0–2.0)
Acid-base deficit: 14.5 mmol/L — ABNORMAL HIGH (ref 0.0–2.0)
Bicarbonate: 23.7 mmol/L (ref 20.0–28.0)
Bicarbonate: 21.6 mmol/L (ref 20.0–28.0)
Bicarbonate: 17.4 mmol/L — ABNORMAL LOW (ref 20.0–28.0)
Drawn by: 441261
Drawn by: 295031
Drawn by: 225631
FIO2: 100
FIO2: 100
FIO2: 50
LHR: 12 {breaths}/min
LHR: 16 {breaths}/min
MECHVT: 540 mL
MECHVT: 600 mL
Mechanical Rate: 10
O2 SAT: 97.2 %
O2 Saturation: 98.8 %
O2 Saturation: 99.3 %
PATIENT TEMPERATURE: 98.6
PATIENT TEMPERATURE: 97.4
PEEP/CPAP: 10 cmH2O
PEEP: 5 cmH2O
PEEP: 10 cmH2O
PH ART: 7.126 — AB (ref 7.350–7.450)
PO2 ART: 517 mmHg — AB (ref 83.0–108.0)
PO2 ART: 127 mmHg — AB (ref 83.0–108.0)
Patient temperature: 98.6
RATE: 14 resp/min
VT: 600 mL
pCO2 arterial: 88.1 mmHg (ref 32.0–48.0)
pCO2 arterial: 68.5 mmHg (ref 32.0–48.0)
pCO2 arterial: 59.8 mmHg — ABNORMAL HIGH (ref 32.0–48.0)
pH, Arterial: 7.058 — CL (ref 7.350–7.450)
pH, Arterial: 7.086 — CL (ref 7.350–7.450)
pO2, Arterial: 428 mmHg — ABNORMAL HIGH (ref 83.0–108.0)

## 2017-09-09 LAB — CBC WITH DIFFERENTIAL/PLATELET
Basophils Absolute: 0 10*3/uL (ref 0.0–0.1)
Basophils Relative: 0 %
Eosinophils Absolute: 0.3 10*3/uL (ref 0.0–0.7)
Eosinophils Relative: 3 %
HCT: 48.7 % (ref 39.0–52.0)
Hemoglobin: 17.2 g/dL — ABNORMAL HIGH (ref 13.0–17.0)
Lymphocytes Relative: 22 %
Lymphs Abs: 1.9 10*3/uL (ref 0.7–4.0)
MCH: 33.5 pg (ref 26.0–34.0)
MCHC: 35.3 g/dL (ref 30.0–36.0)
MCV: 94.9 fL (ref 78.0–100.0)
Monocytes Absolute: 0.8 10*3/uL (ref 0.1–1.0)
Monocytes Relative: 10 %
Neutro Abs: 5.4 10*3/uL (ref 1.7–7.7)
Neutrophils Relative %: 65 %
Platelets: 283 10*3/uL (ref 150–400)
RBC: 5.13 MIL/uL (ref 4.22–5.81)
RDW: 12.3 % (ref 11.5–15.5)
WBC: 8.4 10*3/uL (ref 4.0–10.5)

## 2017-09-09 LAB — BASIC METABOLIC PANEL
Anion gap: 12 (ref 5–15)
BUN: 12 mg/dL (ref 6–20)
CO2: 21 mmol/L — ABNORMAL LOW (ref 22–32)
Calcium: 9.4 mg/dL (ref 8.9–10.3)
Chloride: 108 mmol/L (ref 101–111)
Creatinine, Ser: 1.3 mg/dL — ABNORMAL HIGH (ref 0.61–1.24)
GFR calc Af Amer: >60 mL/min (ref >60)
GFR calc non Af Amer: >60 mL/min (ref >60)
Glucose, Bld: 101 mg/dL — ABNORMAL HIGH (ref 65–99)
Potassium: 3.4 mmol/L — ABNORMAL LOW (ref 3.5–5.1)
Sodium: 141 mmol/L (ref 135–145)

## 2017-09-09 LAB — MRSA PCR SCREENING: MRSA BY PCR: NEGATIVE

## 2017-09-09 LAB — GLUCOSE, CAPILLARY: GLUCOSE-CAPILLARY: 123 mg/dL — AB (ref 65–99)

## 2017-09-09 LAB — TRIGLYCERIDES: TRIGLYCERIDES: 154 mg/dL — AB (ref <150)

## 2017-09-09 MED ORDER — FENTANYL CITRATE (PF) 100 MCG/2ML IJ SOLN
50.0000 ug | Freq: Once | INTRAMUSCULAR | Status: AC
  Administered 2017-09-09: 50 ug via INTRAVENOUS

## 2017-09-09 MED ORDER — MAGNESIUM SULFATE 2 GM/50ML IV SOLN
2.0000 g | Freq: Once | INTRAVENOUS | Status: AC
  Administered 2017-09-09: 2 g via INTRAVENOUS

## 2017-09-09 MED ORDER — ONDANSETRON HCL 4 MG/2ML IJ SOLN
4.0000 mg | Freq: Four times a day (QID) | INTRAMUSCULAR | Status: DC | PRN
  Administered 2017-09-16 – 2017-09-18 (×3): 4 mg via INTRAVENOUS
  Filled 2017-09-09 (×4): qty 2

## 2017-09-09 MED ORDER — ETOMIDATE 2 MG/ML IV SOLN
INTRAVENOUS | Status: AC | PRN
  Administered 2017-09-09: 20 mg via INTRAVENOUS

## 2017-09-09 MED ORDER — FAMOTIDINE 40 MG/5ML PO SUSR: 20.0000 mg | Freq: Two times a day (BID) | ORAL | Status: DC

## 2017-09-09 MED ORDER — ACETAMINOPHEN 325 MG PO TABS
650.0000 mg | ORAL_TABLET | Freq: Four times a day (QID) | ORAL | Status: DC | PRN
  Administered 2017-09-14: 650 mg via ORAL
  Filled 2017-09-09: qty 2

## 2017-09-09 MED ORDER — VECURONIUM BOLUS VIA INFUSION
10.0000 mg | Freq: Once | INTRAVENOUS | Status: AC
  Administered 2017-09-09: 10 mg via INTRAVENOUS
  Filled 2017-09-09: qty 10

## 2017-09-09 MED ORDER — SODIUM BICARBONATE 8.4 % IV SOLN
50.0000 meq | Freq: Once | INTRAVENOUS | Status: AC
  Administered 2017-09-09 (×2): 50 meq via INTRAVENOUS

## 2017-09-09 MED ORDER — ALBUTEROL (5 MG/ML) CONTINUOUS INHALATION SOLN
5.0000 mg/h | INHALATION_SOLUTION | RESPIRATORY_TRACT | Status: DC
  Administered 2017-09-09: 5 mg/h via RESPIRATORY_TRACT
  Filled 2017-09-09: qty 20

## 2017-09-09 MED ORDER — ALBUTEROL SULFATE (2.5 MG/3ML) 0.083% IN NEBU
2.5000 mg | INHALATION_SOLUTION | RESPIRATORY_TRACT | Status: DC | PRN
  Administered 2017-09-17 – 2017-09-18 (×2): 2.5 mg via RESPIRATORY_TRACT
  Filled 2017-09-09 (×3): qty 3

## 2017-09-09 MED ORDER — POTASSIUM CHLORIDE 20 MEQ/15ML (10%) PO SOLN
40.0000 meq | Freq: Once | ORAL | Status: AC
  Administered 2017-09-09: 40 meq
  Filled 2017-09-09: qty 30

## 2017-09-09 MED ORDER — FENTANYL CITRATE (PF) 100 MCG/2ML IJ SOLN
100.0000 ug | INTRAMUSCULAR | Status: DC | PRN
  Administered 2017-09-09: 100 ug via INTRAVENOUS

## 2017-09-09 MED ORDER — SODIUM CHLORIDE 0.9 % IV SOLN: 250.0000 mL | INTRAVENOUS | Status: DC | PRN

## 2017-09-09 MED ORDER — ALBUTEROL SULFATE (2.5 MG/3ML) 0.083% IN NEBU
INHALATION_SOLUTION | RESPIRATORY_TRACT | Status: AC
  Administered 2017-09-09: 14:00:00
  Filled 2017-09-09: qty 12

## 2017-09-09 MED ORDER — IPRATROPIUM-ALBUTEROL 0.5-2.5 (3) MG/3ML IN SOLN
3.0000 mL | Freq: Once | RESPIRATORY_TRACT | Status: AC
  Administered 2017-09-09: 3 mL via RESPIRATORY_TRACT

## 2017-09-09 MED ORDER — MAGNESIUM SULFATE 2 GM/50ML IV SOLN
2.0000 g | Freq: Once | INTRAVENOUS | Status: AC
  Administered 2017-09-09: 2 g via INTRAVENOUS
  Filled 2017-09-09: qty 50

## 2017-09-09 MED ORDER — ARTIFICIAL TEARS OPHTHALMIC OINT
1.0000 "application " | TOPICAL_OINTMENT | Freq: Three times a day (TID) | OPHTHALMIC | Status: DC
  Administered 2017-09-09 – 2017-09-15 (×16): 1 via OPHTHALMIC
  Filled 2017-09-09 (×4): qty 3.5

## 2017-09-09 MED ORDER — HYDRALAZINE HCL 20 MG/ML IJ SOLN
10.0000 mg | INTRAMUSCULAR | Status: DC | PRN
  Administered 2017-09-12 – 2017-09-15 (×8): 20 mg via INTRAVENOUS
  Filled 2017-09-09 (×9): qty 1

## 2017-09-09 MED ORDER — SODIUM CHLORIDE 0.9 % IV SOLN
INTRAVENOUS | Status: DC
  Administered 2017-09-09 (×2): via INTRAVENOUS

## 2017-09-09 MED ORDER — EPINEPHRINE 0.3 MG/0.3ML IJ SOAJ
0.3000 mg | Freq: Once | INTRAMUSCULAR | Status: AC
  Administered 2017-09-09: 0.3 mg via INTRAMUSCULAR
  Filled 2017-09-09: qty 0.3

## 2017-09-09 MED ORDER — ALBUTEROL SULFATE (2.5 MG/3ML) 0.083% IN NEBU
2.5000 mg | INHALATION_SOLUTION | Freq: Once | RESPIRATORY_TRACT | Status: AC
  Administered 2017-09-09: 2.5 mg via RESPIRATORY_TRACT
  Filled 2017-09-09: qty 3

## 2017-09-09 MED ORDER — FENTANYL CITRATE (PF) 100 MCG/2ML IJ SOLN: 100.0000 ug | INTRAMUSCULAR | Status: DC | PRN

## 2017-09-09 MED ORDER — METHYLPREDNISOLONE SODIUM SUCC 125 MG IJ SOLR
INTRAMUSCULAR | Status: AC
  Administered 2017-09-09: 125 mg
  Filled 2017-09-09: qty 2

## 2017-09-09 MED ORDER — ALBUTEROL SULFATE (2.5 MG/3ML) 0.083% IN NEBU
5.0000 mg | INHALATION_SOLUTION | Freq: Once | RESPIRATORY_TRACT | Status: AC
  Administered 2017-09-09: 5 mg via RESPIRATORY_TRACT
  Filled 2017-09-09: qty 6

## 2017-09-09 MED ORDER — HEPARIN SODIUM (PORCINE) 5000 UNIT/ML IJ SOLN
5000.0000 [IU] | Freq: Three times a day (TID) | INTRAMUSCULAR | Status: DC
  Administered 2017-09-09 – 2017-09-18 (×26): 5000 [IU] via SUBCUTANEOUS
  Filled 2017-09-09 (×30): qty 1

## 2017-09-09 MED ORDER — STERILE WATER FOR INJECTION IV SOLN
INTRAVENOUS | Status: DC
  Administered 2017-09-09 – 2017-09-11 (×5): via INTRAVENOUS
  Filled 2017-09-09 (×8): qty 850

## 2017-09-09 MED ORDER — ALBUTEROL (5 MG/ML) CONTINUOUS INHALATION SOLN
10.0000 mg/h | INHALATION_SOLUTION | Freq: Once | RESPIRATORY_TRACT | Status: DC
  Filled 2017-09-09: qty 20

## 2017-09-09 MED ORDER — FENTANYL CITRATE (PF) 100 MCG/2ML IJ SOLN
INTRAMUSCULAR | Status: AC
  Filled 2017-09-09: qty 2

## 2017-09-09 MED ORDER — IPRATROPIUM-ALBUTEROL 0.5-2.5 (3) MG/3ML IN SOLN
3.0000 mL | RESPIRATORY_TRACT | Status: DC
  Administered 2017-09-09 (×2): 3 mL via RESPIRATORY_TRACT
  Filled 2017-09-09 (×2): qty 3

## 2017-09-09 MED ORDER — SUCCINYLCHOLINE CHLORIDE 20 MG/ML IJ SOLN
INTRAMUSCULAR | Status: AC | PRN
  Administered 2017-09-09: 100 mg via INTRAVENOUS

## 2017-09-09 MED ORDER — VECURONIUM BROMIDE 10 MG IV SOLR
0.8000 ug/kg/min | INTRAVENOUS | Status: DC
  Administered 2017-09-09: 0.801 ug/kg/min via INTRAVENOUS
  Administered 2017-09-10: 1.4 ug/kg/min via INTRAVENOUS
  Filled 2017-09-09 (×2): qty 100

## 2017-09-09 MED ORDER — IPRATROPIUM BROMIDE 0.02 % IN SOLN
1.0000 mg | Freq: Once | RESPIRATORY_TRACT | Status: AC
  Administered 2017-09-09: 1 mg via RESPIRATORY_TRACT

## 2017-09-09 MED ORDER — FENTANYL BOLUS VIA INFUSION
50.0000 ug | INTRAVENOUS | Status: DC | PRN
  Administered 2017-09-10 – 2017-09-14 (×5): 50 ug via INTRAVENOUS
  Filled 2017-09-09: qty 50

## 2017-09-09 MED ORDER — SODIUM BICARBONATE 8.4 % IV SOLN
INTRAVENOUS | Status: AC
  Administered 2017-09-09: 50 meq via INTRAVENOUS
  Filled 2017-09-09: qty 50

## 2017-09-09 MED ORDER — SODIUM BICARBONATE 8.4 % IV SOLN
INTRAVENOUS | Status: AC
  Administered 2017-09-09: 50 meq via INTRAVENOUS
  Filled 2017-09-09: qty 100

## 2017-09-09 MED ORDER — SODIUM CHLORIDE 0.9 % IV BOLUS
1000.0000 mL | Freq: Once | INTRAVENOUS | Status: AC
  Administered 2017-09-09: 1000 mL via INTRAVENOUS

## 2017-09-09 MED ORDER — METHYLPREDNISOLONE SODIUM SUCC 125 MG IJ SOLR
60.0000 mg | Freq: Two times a day (BID) | INTRAMUSCULAR | Status: DC
  Administered 2017-09-09: 60 mg via INTRAVENOUS

## 2017-09-09 MED ORDER — BUDESONIDE 0.5 MG/2ML IN SUSP
0.5000 mg | Freq: Two times a day (BID) | RESPIRATORY_TRACT | Status: DC
  Administered 2017-09-09 – 2017-09-13 (×8): 0.5 mg via RESPIRATORY_TRACT
  Filled 2017-09-09 (×8): qty 2

## 2017-09-09 MED ORDER — MAGNESIUM SULFATE 50 % IJ SOLN: 2.0000 g | Freq: Once | INTRAMUSCULAR | Status: DC

## 2017-09-09 MED ORDER — PROPOFOL 1000 MG/100ML IV EMUL
25.0000 ug/kg/min | INTRAVENOUS | Status: DC
  Administered 2017-09-09: 50.131 ug/kg/min via INTRAVENOUS
  Administered 2017-09-09: 50 ug/kg/min via INTRAVENOUS
  Administered 2017-09-10 (×2): 65 ug/kg/min via INTRAVENOUS
  Administered 2017-09-10: 55 ug/kg/min via INTRAVENOUS
  Administered 2017-09-10: 30 ug/kg/min via INTRAVENOUS
  Administered 2017-09-10 – 2017-09-11 (×4): 55 ug/kg/min via INTRAVENOUS
  Administered 2017-09-11: 70 ug/kg/min via INTRAVENOUS
  Administered 2017-09-11: 65 ug/kg/min via INTRAVENOUS
  Administered 2017-09-12 (×5): 50 ug/kg/min via INTRAVENOUS
  Administered 2017-09-13: 40 ug/kg/min via INTRAVENOUS
  Administered 2017-09-13 (×3): 50 ug/kg/min via INTRAVENOUS
  Administered 2017-09-14 (×2): 60 ug/kg/min via INTRAVENOUS
  Filled 2017-09-09 (×3): qty 100
  Filled 2017-09-09: qty 1000
  Filled 2017-09-09 (×19): qty 100

## 2017-09-09 MED ORDER — RANITIDINE HCL 150 MG/10ML PO SYRP
150.0000 mg | ORAL_SOLUTION | Freq: Two times a day (BID) | ORAL | Status: DC
  Administered 2017-09-09 – 2017-09-13 (×9): 150 mg
  Filled 2017-09-09 (×10): qty 10

## 2017-09-09 MED ORDER — SODIUM CHLORIDE 0.9 % IV SOLN
0.5000 mg/kg/h | INTRAVENOUS | Status: DC
  Administered 2017-09-09 – 2017-09-10 (×2): 0.5 mg/kg/h via INTRAVENOUS
  Filled 2017-09-09: qty 20
  Filled 2017-09-09: qty 2

## 2017-09-09 MED ORDER — FENTANYL 2500MCG IN NS 250ML (10MCG/ML) PREMIX INFUSION
25.0000 ug/h | INTRAVENOUS | Status: DC
  Administered 2017-09-09: 25 ug/h via INTRAVENOUS
  Administered 2017-09-10: 300 ug/h via INTRAVENOUS
  Administered 2017-09-10 – 2017-09-11 (×2): 350 ug/h via INTRAVENOUS
  Administered 2017-09-11 (×2): 400 ug/h via INTRAVENOUS
  Administered 2017-09-11 – 2017-09-12 (×4): 350 ug/h via INTRAVENOUS
  Administered 2017-09-13: 400 ug/h via INTRAVENOUS
  Administered 2017-09-13: 275 ug/h via INTRAVENOUS
  Administered 2017-09-13: 250 ug/h via INTRAVENOUS
  Administered 2017-09-14 (×2): 400 ug/h via INTRAVENOUS
  Filled 2017-09-09 (×15): qty 250

## 2017-09-09 MED ORDER — DOCUSATE SODIUM 50 MG/5ML PO LIQD
100.0000 mg | Freq: Two times a day (BID) | ORAL | Status: DC | PRN
  Administered 2017-09-12 – 2017-09-16 (×4): 100 mg
  Filled 2017-09-09 (×4): qty 10

## 2017-09-09 MED ORDER — ALBUTEROL SULFATE (2.5 MG/3ML) 0.083% IN NEBU
2.5000 mg | INHALATION_SOLUTION | Freq: Once | RESPIRATORY_TRACT | Status: AC
  Administered 2017-09-09: 2.5 mg via RESPIRATORY_TRACT

## 2017-09-09 MED ORDER — FENTANYL CITRATE (PF) 100 MCG/2ML IJ SOLN
100.0000 ug | Freq: Once | INTRAMUSCULAR | Status: AC
Start: 2017-09-09 — End: 2017-09-09
  Administered 2017-09-09: 100 ug via INTRAVENOUS
  Filled 2017-09-09: qty 2

## 2017-09-09 MED ORDER — ARFORMOTEROL TARTRATE 15 MCG/2ML IN NEBU
15.0000 ug | INHALATION_SOLUTION | Freq: Two times a day (BID) | RESPIRATORY_TRACT | Status: DC
  Administered 2017-09-09 – 2017-09-15 (×12): 15 ug via RESPIRATORY_TRACT
  Filled 2017-09-09 (×12): qty 2

## 2017-09-09 MED ORDER — MIDAZOLAM HCL 2 MG/2ML IJ SOLN
2.0000 mg | Freq: Once | INTRAMUSCULAR | Status: AC
  Administered 2017-09-09: 2 mg via INTRAVENOUS
  Filled 2017-09-09: qty 2

## 2017-09-09 MED ORDER — ALBUTEROL (5 MG/ML) CONTINUOUS INHALATION SOLN
10.0000 mg/h | INHALATION_SOLUTION | RESPIRATORY_TRACT | Status: DC
  Administered 2017-09-09: 10 mg/h via RESPIRATORY_TRACT

## 2017-09-09 MED ORDER — METHYLPREDNISOLONE SODIUM SUCC 125 MG IJ SOLR
60.0000 mg | Freq: Four times a day (QID) | INTRAMUSCULAR | Status: DC
  Administered 2017-09-09 – 2017-09-14 (×18): 60 mg via INTRAVENOUS
  Filled 2017-09-09 (×3): qty 0.96
  Filled 2017-09-09: qty 2
  Filled 2017-09-09 (×3): qty 0.96
  Filled 2017-09-09: qty 2
  Filled 2017-09-09: qty 0.96
  Filled 2017-09-09: qty 2
  Filled 2017-09-09 (×2): qty 0.96
  Filled 2017-09-09: qty 2
  Filled 2017-09-09 (×6): qty 0.96
  Filled 2017-09-09: qty 2
  Filled 2017-09-09: qty 0.96

## 2017-09-09 MED ORDER — FENTANYL CITRATE (PF) 100 MCG/2ML IJ SOLN
100.0000 ug | Freq: Once | INTRAMUSCULAR | Status: AC
  Administered 2017-09-09: 100 ug via INTRAVENOUS

## 2017-09-09 MED ORDER — LORAZEPAM 2 MG/ML IJ SOLN
0.5000 mg | Freq: Once | INTRAMUSCULAR | Status: DC
  Filled 2017-09-09: qty 1

## 2017-09-09 MED ORDER — PHENYLEPHRINE HCL-NACL 10-0.9 MG/250ML-% IV SOLN
0.0000 ug/min | INTRAVENOUS | Status: DC
  Administered 2017-09-09: 20 ug/min via INTRAVENOUS
  Administered 2017-09-09: 200 ug/min via INTRAVENOUS
  Administered 2017-09-09: 130 ug/min via INTRAVENOUS
  Administered 2017-09-10: 25 ug/min via INTRAVENOUS
  Filled 2017-09-09 (×7): qty 250

## 2017-09-09 MED ORDER — PROPOFOL 1000 MG/100ML IV EMUL
0.0000 ug/kg/min | INTRAVENOUS | Status: DC
  Administered 2017-09-09: 20 ug/kg/min via INTRAVENOUS
  Filled 2017-09-09: qty 100

## 2017-09-09 MED ORDER — PROPOFOL 1000 MG/100ML IV EMUL
0.0000 ug/kg/min | INTRAVENOUS | Status: DC
  Administered 2017-09-09: 50 ug/kg/min via INTRAVENOUS

## 2017-09-09 NOTE — ED Notes (Signed)
Respiratory and PA made aware of patient. He is not tolerating BiPap well and he is scheduled to receive another continuous treatment.

## 2017-09-09 NOTE — ED Provider Notes (Signed)
[facility]-EMERGENCY DEPT Provider Note   CSN: [id number] Arrival date & time: 09/09/17  5621     History   Chief Complaint Chief Complaint  Patient presents with  . Asthma    HPI Jeremy Mccormick is a 32 y.o. male with history of asthma who presents with acute onset shortness of breath, wheezing, chest tightness and pain that began this morning.  Patient has been out of his inhalers.  He does not take any other medications for his asthma.  Patient was given albuterol and DuoNeb in triage, however continues to have severe shortness of breath.  He denies any fevers.  He reports he was feeling fine yesterday.  He reports he did have some sore throat, however that is resolved after the albuterol.  He has had associated cough with clear sputum since this morning.  HPI  Past Medical History:  Diagnosis Date  . Asthma   . Asthma     Patient Active Problem List   Diagnosis Date Noted  . Asthma exacerbation 09/09/2017    History reviewed. No pertinent surgical history.      Home Medications    Prior to Admission medications   Medication Sig Start Date End Date Taking? Authorizing Provider  acetaminophen (TYLENOL) 500 MG tablet Take 2 tablets (1,000 mg total) by mouth every 8 (eight) hours as needed for moderate pain or fever. 01/25/17  Yes Antony Madura, PA-C  albuterol (PROVENTIL HFA;VENTOLIN HFA) 108 (90 Base) MCG/ACT inhaler Inhale 1-2 puffs into the lungs every 6 (six) hours as needed for wheezing or shortness of breath. 01/25/17  Yes Antony Madura, PA-C  azithromycin (ZITHROMAX) 250 MG tablet Take 1 tablet (250 mg total) by mouth daily. Take first 2 tablets together, then 1 every day until finished. Patient not taking: Reported on 09/09/2017 01/25/17   Antony Madura, PA-C  benzonatate (TESSALON) 100 MG capsule Take 1 capsule (100 mg total) by mouth 3 (three) times daily as needed for cough. Patient not taking: Reported on 09/09/2017 01/25/17   Antony Madura, PA-C    predniSONE (DELTASONE) 20 MG tablet Take 2 tablets (40 mg total) by mouth daily. Take 40 mg by mouth daily for 3 days, then  by mouth daily for 3 days, then  daily for 3 days Patient not taking: Reported on 09/09/2017 01/25/17   Antony Madura, PA-C    Family History No family history on file.  Social History Social History   Tobacco Use  . Smoking status: Former Smoker    Packs/day: 0.50    Years: 2.00    Pack years: 1.00  . Smokeless tobacco: Never Used  Substance Use Topics  . Alcohol use: Yes    Comment: rarely   . Drug use: Yes    Types: Marijuana     Allergies   Patient has no known allergies.   Review of Systems Review of Systems  Constitutional: Negative for chills and fever.  HENT: Negative for facial swelling and sore throat.   Respiratory: Positive for cough, chest tightness, shortness of breath and wheezing. Negative for stridor.   Cardiovascular: Positive for chest pain.  Gastrointestinal: Negative for abdominal pain, nausea and vomiting.  Genitourinary: Negative for dysuria.  Musculoskeletal: Negative for back pain.  Skin: Negative for rash and wound.  Neurological: Negative for headaches.  Psychiatric/Behavioral: The patient is not nervous/anxious.      Physical Exam Updated Vital Signs BP 105/68   Pulse (!) 129   Temp 98.1 F (36.7 C) (Oral)   Resp  10   Ht 5' 7.01" (1.702 m)   Wt 63.5 kg (140 lb)   SpO2 97%   BMI 21.92 kg/m   Physical Exam  Constitutional: He appears well-developed and well-nourished. No distress.  HENT:  Head: Normocephalic and atraumatic.  Mouth/Throat: Oropharynx is clear and moist. No oropharyngeal exudate.  Eyes: Pupils are equal, round, and reactive to light. Conjunctivae are normal. Right eye exhibits no discharge. Left eye exhibits no discharge. No scleral icterus.  Neck: Normal range of motion. Neck supple. No thyromegaly present.  Cardiovascular: Normal rate, regular rhythm, normal heart sounds and intact  distal pulses. Exam reveals no gallop and no friction rub.  No murmur heard. Pulmonary/Chest: No stridor. He is in respiratory distress. He has wheezes (Expiratory wheezes bilaterally). He has no rales.  Patient in tripod position Patient not speaking in complete sentences  Abdominal: Soft. Bowel sounds are normal. He exhibits no distension. There is no tenderness. There is no rebound and no guarding.  Musculoskeletal: He exhibits no edema.  Lymphadenopathy:    He has no cervical adenopathy.  Neurological: He is alert. Coordination normal.  Skin: Skin is warm and dry. No rash noted. He is not diaphoretic. No pallor.  Psychiatric: He has a normal mood and affect.  Nursing note and vitals reviewed.    ED Treatments / Results  Labs (all labs ordered are listed, but only abnormal results are displayed) Labs Reviewed  BLOOD GAS, ARTERIAL - Abnormal; Notable for the following components:      Result Value   pH, Arterial 7.058 (*)    pCO2 arterial 88.1 (*)    pO2, Arterial 428 (*)    Acid-base deficit 10.4 (*)    All other components within normal limits  BASIC METABOLIC PANEL - Abnormal; Notable for the following components:   Potassium 3.4 (*)    CO2 21 (*)    Glucose, Bld 101 (*)    Creatinine, Ser 1.30 (*)    All other components within normal limits  CBC WITH DIFFERENTIAL/PLATELET - Abnormal; Notable for the following components:   Hemoglobin 17.2 (*)    All other components within normal limits  CULTURE, RESPIRATORY (NON-EXPECTORATED)  TRIGLYCERIDES  HIV ANTIBODY (ROUTINE TESTING)  TRIGLYCERIDES    EKG None  Radiology Dg Chest Portable 1 View  Result Date: 09/09/2017 CLINICAL DATA:  Endotracheal and enteric tube placement. EXAM: PORTABLE CHEST 1 VIEW COMPARISON:  Chest x-ray dated February 01, 2017. FINDINGS: Endotracheal tube in place with the tip approximately 4.5 cm above the level of the carina. Enteric tube seen entering the stomach with the tip below the field of  view. The heart size and mediastinal contours are within normal limits. Normal pulmonary vascularity. No focal consolidation, pleural effusion, or pneumothorax. No acute osseous abnormality. IMPRESSION: 1. Appropriately positioned endotracheal tube. Enteric tube entering the stomach with the tip below the field of view. 2.  No active cardiopulmonary disease. Electronically Signed   By: Obie Dredge M.D.   On: 09/09/2017 14:38    Procedures Procedures (including critical care time)  CRITICAL CARE Performed by: Emi Holes   Total critical care time: 35 minutes  Critical care time was exclusive of separately billable procedures and treating other patients.  Critical care was necessary to treat or prevent imminent or life-threatening deterioration.  Critical care was time spent personally by me on the following activities: development of treatment plan with patient and/or surrogate as well as nursing, discussions with consultants, evaluation of patient's response to treatment, examination  of patient, obtaining history from patient or surrogate, ordering and performing treatments and interventions, ordering and review of laboratory studies, ordering and review of radiographic studies, pulse oximetry and re-evaluation of patient's condition.   Medications Ordered in ED Medications  LORazepam (ATIVAN) injection 0.5 mg (0 mg Intravenous Hold 09/09/17 1437)  fentaNYL (SUBLIMAZE) injection 100 mcg (100 mcg Intravenous Given 09/09/17 1352)  fentaNYL (SUBLIMAZE) injection 100 mcg (has no administration in time range)  albuterol (PROVENTIL,VENTOLIN) solution continuous neb (10 mg/hr Nebulization Not Given 09/09/17 1412)  0.9 %  sodium chloride infusion (has no administration in time range)  heparin injection 5,000 Units (has no administration in time range)  famotidine (PEPCID) 40 MG/5ML suspension 20 mg (has no administration in time range)  acetaminophen (TYLENOL) tablet 650 mg (has no  administration in time range)  ondansetron (ZOFRAN) injection 4 mg (has no administration in time range)  ipratropium-albuterol (DUONEB) 0.5-2.5 (3) MG/3ML nebulizer solution 3 mL (3 mLs Nebulization Given 09/09/17 1511)  albuterol (PROVENTIL) (2.5 MG/3ML) 0.083% nebulizer solution 2.5 mg (has no administration in time range)  fentaNYL in NS (41mcg/ml) infusion-PREMIX (25 mcg/hr Intravenous New Bag/Given 09/09/17 1457)  fentaNYL (SUBLIMAZE) bolus via infusion 50 mcg (has no administration in time range)  docusate (COLACE) 50 MG/5ML liquid 100 mg (has no administration in time range)  methylPREDNISolone sodium succinate (SOLU-MEDROL) 125 mg/2 mL injection 60 mg (60 mg Intravenous Given 09/09/17 1449)  artificial tears (LACRILUBE) ophthalmic ointment 1 application (has no administration in time range)  vecuronium (NORCURON) 100 mg in sodium chloride 0.9 % 200 mL (0.5 mg/mL) infusion (0.801 mcg/kg/min  63.5 kg Intravenous New Bag/Given 09/09/17 1542)  propofol (DIPRIVAN) 1000 MG/100ML infusion (50.131 mcg/kg/min  63.5 kg Intravenous New Bag/Given 09/09/17 1527)  potassium chloride 20 MEQ/15ML (10%) solution 40 mEq (has no administration in time range)  0.9 %  sodium chloride infusion (has no administration in time range)  hydrALAZINE (APRESOLINE) injection 10-40 mg (has no administration in time range)  albuterol (PROVENTIL) (2.5 MG/3ML) 0.083% nebulizer solution 5 mg (5 mg Nebulization Given 09/09/17 0953)  albuterol (PROVENTIL) (2.5 MG/3ML) 0.083% nebulizer solution 2.5 mg (2.5 mg Nebulization Given 09/09/17 1017)  ipratropium-albuterol (DUONEB) 0.5-2.5 (3) MG/3ML nebulizer solution 3 mL (3 mLs Nebulization Given 09/09/17 1017)  magnesium sulfate IVPB 2 g 50 mL (0 g Intravenous Stopped 09/09/17 1219)  ipratropium (ATROVENT) nebulizer solution 1 mg (1 mg Nebulization Given 09/09/17 1110)  EPINEPHrine (EPI-PEN) injection 0.3 mg (0.3 mg Intramuscular Given 09/09/17 1218)  albuterol (PROVENTIL) (2.5 MG/3ML)  0.083% nebulizer solution (  Given 09/09/17 1346)  fentaNYL (SUBLIMAZE) injection 100 mcg (100 mcg Intravenous Given 09/09/17 1349)  fentaNYL (SUBLIMAZE) injection 100 mcg (100 mcg Intravenous Given 09/09/17 1357)  magnesium sulfate IVPB 2 g 50 mL (0 g Intravenous Stopped 09/09/17 1554)  etomidate (AMIDATE) injection (20 mg Intravenous Given 09/09/17 1330)  succinylcholine (ANECTINE) injection (100 mg Intravenous Given 09/09/17 1331)  midazolam (VERSED) injection 2 mg (2 mg Intravenous Given 09/09/17 1418)  albuterol (PROVENTIL) (2.5 MG/3ML) 0.083% nebulizer solution 2.5 mg (2.5 mg Nebulization Given 09/09/17 1412)  fentaNYL (SUBLIMAZE) injection 50 mcg (50 mcg Intravenous Bolus 09/09/17 1500)  methylPREDNISolone sodium succinate (SOLU-MEDROL) 125 mg/2 mL injection (125 mg  Given 09/09/17 1448)  vecuronium (NORCURON) bolus via infusion 10 mg (10 mg Intravenous Bolus from Bag 09/09/17 1545)     Initial Impression / Assessment and Plan / ED Course  I have reviewed the triage vital signs and the nursing notes.  Pertinent labs & imaging results  that were available during my care of the patient were reviewed by me and considered in my medical decision making (see chart for details).     Patient presenting with severe, acute asthma exacerbation.  He had symptoms since yesterday.  He reports he ran out of his albuterol inhaler.  Patient tripoding on my first evaluation and not speaking in complete sentences.  Patient given several albuterol and DuoNeb nebulizers in the ED without much relief.  Patient placed on BiPAP.  Patient given Solu-Medrol, mag sulfate, and epi 0.3 mg without relief.  Patient got progressively more tachypneic and quickly dropped his O2 sats to the high 80s.  At that time, patient lung sounds were very decreased.  Emergent intubation performed by my attending, Dr. Erma Heritage, see his note for procedure.  Etomidate, succinylcholine used for intubation.  Fentanyl given after for sedation.  Dr. Erma Heritage  consulted critical care who evaluated the patient and will admit to the ICU for further management.  Final Clinical Impressions(s) / ED Diagnoses   Final diagnoses:  Acute respiratory distress  Exacerbation of asthma, unspecified asthma severity, unspecified whether persistent    ED Discharge Orders    None       Emi Holes, PA-C 09/09/17 1614    Shaune Pollack, MD 09/11/17 1313

## 2017-09-09 NOTE — ED Notes (Signed)
Bed: WTR6 Expected date:  Expected time:  Means of arrival:  Comments: 

## 2017-09-09 NOTE — ED Notes (Signed)
Bed: WA07 Expected date:  Expected time:  Means of arrival:  Comments: 

## 2017-09-09 NOTE — ED Triage Notes (Signed)
Patient c/o asthma/expiratory wheezing since yesterday. Patient states he ran out of his albuterol inhaler. Patient has productive cough with clear sputum.

## 2017-09-09 NOTE — H&P (Addendum)
PULMONARY / CRITICAL CARE MEDICINE   Name: Jeremy Mccormick MRN: [id number] DOB: 1986/01/08    ADMISSION DATE:  09/09/2017 CONSULTATION DATE:  09/09/2017  REFERRING MD:  Dr. Ellender Hose  CHIEF COMPLAINT:  Wheezing and Shortness of Breath  HISTORY OF PRESENT ILLNESS:   Mr. Jeremy Mccormick is a 32 y/o male with a PMH of asthma.  He presents to [facility] ED on 5/6 with c/o acute onset of wheezing, shortness of breath, and chest tightness.  He began to have complaints of expiratory wheezing and productive cough with clear sputum since 5/5.  It was noted that he has been out of his inhalers .  Pt is currently intubated and sedated, with no family present, therefore history is obtained from ED notes.  In the ED pt received continuous Albuterol treatment, magnesium IV, Solu-Medrol IV, and Epinephrine, however continued to experience respiratory distress.  Trial of BiPap was not tolerated, and pt was subsequently intubated in the ED.  CXR is not concerning for any acute Pulmonary disease.   PCCM is consulted for admission for Acute Hypercapnic Respiratory Failure secondary to Asthma exacerbation requiring mechanical ventilation.  PAST MEDICAL HISTORY :  He  has a past medical history of Asthma and Asthma.  PAST SURGICAL HISTORY: He  has no past surgical history on file.  No Known Allergies  No current facility-administered medications on file prior to encounter.    Current Outpatient Medications on File Prior to Encounter  Medication Sig  . acetaminophen (TYLENOL) 500 MG tablet Take 2 tablets (1,000 mg total) by mouth every 8 (eight) hours as needed for moderate pain or fever.  Marland Kitchen albuterol (PROVENTIL HFA;VENTOLIN HFA) 108 (90 Base) MCG/ACT inhaler Inhale 1-2 puffs into the lungs every 6 (six) hours as needed for wheezing or shortness of breath.  Marland Kitchen azithromycin (ZITHROMAX) 250 MG tablet Take 1 tablet (250 mg total) by mouth daily. Take first 2 tablets together, then 1 every day until finished. (Patient not taking:  Reported on 09/09/2017)  . benzonatate (TESSALON) 100 MG capsule Take 1 capsule (100 mg total) by mouth 3 (three) times daily as needed for cough. (Patient not taking: Reported on 09/09/2017)  . predniSONE (DELTASONE) 20 MG tablet Take 2 tablets (40 mg total) by mouth daily. Take 40 mg by mouth daily for 3 days, then '20mg'$  by mouth daily for 3 days, then '10mg'$  daily for 3 days (Patient not taking: Reported on 09/09/2017)    FAMILY HISTORY:  His has no family status information on file.    SOCIAL HISTORY: He  reports that he has quit smoking. He has a 1.00 pack-year smoking history. He has never used smokeless tobacco. He reports that he drinks alcohol. He reports that he has current or past drug history. Drug: Marijuana.  REVIEW OF SYSTEMS:   Unable to obtain as pt is intubated and sedated    SUBJECTIVE:  Unable to obtain as pt is intubated and sedated  VITAL SIGNS: BP 101/76   Pulse (!) 102   Temp 98.1 F (36.7 C) (Oral)   Resp 14   Ht 5' 7.01" (1.702 m)   Wt 140 lb (63.5 kg)   SpO2 96%   BMI 21.92 kg/m   HEMODYNAMICS:    VENTILATOR SETTINGS: Vent Mode: PRVC FiO2 (%):  [30 %-100 %] 100 % Set Rate:  [14 bmp] 14 bmp Vt Set:  [480 mL] 480 mL PEEP:  [5 cmH20] 5 cmH20 Plateau Pressure:  [28 cmH20] 28 cmH20  INTAKE / OUTPUT: No intake/output data recorded.  PHYSICAL EXAMINATION: General:  Acutely ill-appearing African American male, lying in bed, intubated, coughing Neuro:  Sedated, Pupils PERRLA 13m, does not follow commands HEENT:  Atraumatic, normocephalic, No JVD Cardiovascular:  RRR, s1s2 noted, No R/M/G noted, 2+ pulses throughout Lungs:  Severe expiratory wheezing throughout, symmetrical expansion, tachypnea, dyssynchronous with the vent Abdomen:  Soft, nontender, nondistended Musculoskeletal:  Normal bulk and tone, No edema Skin:  Warm, dry, No obvious lesions, rashes, or ulcerations noted  LABS:  BMET Recent Labs  Lab 09/09/17 1252  NA 141  K 3.4*  CL 108   CO2 21*  BUN 12  CREATININE 1.30*  GLUCOSE 101*    Electrolytes Recent Labs  Lab 09/09/17 1252  CALCIUM 9.4    CBC Recent Labs  Lab 09/09/17 1252  WBC 8.4  HGB 17.2*  HCT 48.7  PLT 283    Coag's No results for input(s): APTT, INR in the last 168 hours.  Sepsis Markers No results for input(s): LATICACIDVEN, PROCALCITON, O2SATVEN in the last 168 hours.  ABG No results for input(s): PHART, PCO2ART, PO2ART in the last 168 hours.  Liver Enzymes No results for input(s): AST, ALT, ALKPHOS, BILITOT, ALBUMIN in the last 168 hours.  Cardiac Enzymes No results for input(s): TROPONINI, PROBNP in the last 168 hours.  Glucose No results for input(s): GLUCAP in the last 168 hours.  Imaging No results found.   STUDIES:  None  CULTURES: None  ANTIBIOTICS: None  SIGNIFICANT EVENTS: 5/6>> [facility] Res[facility]  LINES/TUBES: 5/6>>ETT 5/6>> Foley catheter  DISCUSSION: This is a 32y/o male with Acute Hypercapnic Respiratory failure secondary to Acute Asthma exacerbation requiring mechanical ventilation  ASSESSMENT / PLAN:  PULMONARY A: Acute Hypercapnic Respiratory failure secondary to Asthma Exacerbation Hx: Asthma  P:   Full vent support 8 cc/kg TV VAP Bundle Obtain ABG Follow CXR Duonebs q4h IV Solu-Medrol SBT when parameters met  CARDIOVASCULAR A:  Acute HTN, likely in setting of acute agitation and intubation  P:  Telemetry monitoring Reassess BP once sedated, prn Hydralazine if needed for SBP >170 or DBP >100  RENAL A:   Acute Kidney Injury Hypokalemia  P:   Replace K NS @ 75 ml/hr Monitor urine output Follow BMP  GASTROINTESTINAL A:   No acute issues  P:   Keep NPO for now Pepcid for SUP  HEMATOLOGIC A:   No acute issues  P:  Monitor for S/sx of bleeding Follow CBC Heparin for VTE  Prophylaxis  INFECTIOUS A:   No acute issues  P:   Monitor fever curve  Follow CBC  ENDOCRINE A:   No acute issues    P:   Follow ICU Hyperglycemic Protocol Follow Glucose on daily BMP for now  NEUROLOGIC A:   Acute Encephalopathy likely in setting of Hypercapnia and sedation  P:   RASS goal: -1 to -2 Propofol and fentanyl gtt's to maintain RASS goal Provide supportive care   FAMILY  - Updates: No family currently at bedside.  - Inter-disciplinary family meet or Palliative Care meeting due by:  09/16/17   JDarel Hong AGACNP-BC Pulmonary and CJolietPager: [phone] 09/09/2017, 2:22 PM

## 2017-09-10 ENCOUNTER — Inpatient Hospital Stay (HOSPITAL_COMMUNITY): Payer: Self-pay

## 2017-09-10 DIAGNOSIS — I10 Essential (primary) hypertension: Secondary | ICD-10-CM

## 2017-09-10 DIAGNOSIS — G9340 Encephalopathy, unspecified: Secondary | ICD-10-CM

## 2017-09-10 LAB — POCT I-STAT 3, ART BLOOD GAS (G3+)
ACID-BASE DEFICIT: 2 mmol/L (ref 0.0–2.0)
BICARBONATE: 29.4 mmol/L — AB (ref 20.0–28.0)
Bicarbonate: 29.5 mmol/L — ABNORMAL HIGH (ref 20.0–28.0)
O2 Saturation: 99 %
O2 Saturation: 99 %
PCO2 ART: 85.7 mmHg — AB (ref 32.0–48.0)
PCO2 ART: 73.8 mmHg — AB (ref 32.0–48.0)
PH ART: 7.211 — AB (ref 7.350–7.450)
PO2 ART: 216 mmHg — AB (ref 83.0–108.0)
PO2 ART: 162 mmHg — AB (ref 83.0–108.0)
TCO2: 32 mmol/L (ref 22–32)
TCO2: 32 mmol/L (ref 22–32)
pH, Arterial: 7.143 — CL (ref 7.350–7.450)

## 2017-09-10 LAB — BLOOD GAS, ARTERIAL
ACID-BASE DEFICIT: 7.6 mmol/L — AB (ref 0.0–2.0)
ACID-BASE EXCESS: 0.3 mmol/L (ref 0.0–2.0)
Acid-base deficit: 8.8 mmol/L — ABNORMAL HIGH (ref 0.0–2.0)
BICARBONATE: 24.6 mmol/L (ref 20.0–28.0)
BICARBONATE: 28.6 mmol/L — AB (ref 20.0–28.0)
BICARBONATE: 24.3 mmol/L (ref 20.0–28.0)
Drawn by: 11249
Drawn by: 11249
Drawn by: 30136
FIO2: 50
FIO2: 90
FIO2: 50
LHR: 20 {breaths}/min
LHR: 20 {breaths}/min
LHR: 20 {breaths}/min
MECHVT: 350 mL
MECHVT: 550 mL
O2 SAT: 95.9 %
O2 SAT: 99.3 %
O2 Saturation: 93.5 %
PATIENT TEMPERATURE: 98.6
PATIENT TEMPERATURE: 97.5
PATIENT TEMPERATURE: 97.5
PEEP/CPAP: 10 cmH2O
PEEP/CPAP: 10 cmH2O
PEEP/CPAP: 10 cmH2O
PH ART: 7.046 — AB (ref 7.350–7.450)
PH ART: 7.394 (ref 7.350–7.450)
PO2 ART: 72.8 mmHg — AB (ref 83.0–108.0)
PO2 ART: 104 mmHg (ref 83.0–108.0)
PO2 ART: 327 mmHg — AB (ref 83.0–108.0)
VT: 350 mL
pCO2 arterial: 81.3 mmHg (ref 32.0–48.0)
pCO2 arterial: 108 mmHg (ref 32.0–48.0)
pCO2 arterial: 41.1 mmHg (ref 32.0–48.0)
pH, Arterial: 7.098 — CL (ref 7.350–7.450)

## 2017-09-10 LAB — RAPID URINE DRUG SCREEN, HOSP PERFORMED
Amphetamines: NOT DETECTED
Barbiturates: NOT DETECTED
Benzodiazepines: POSITIVE — AB
COCAINE: POSITIVE — AB
OPIATES: NOT DETECTED
Tetrahydrocannabinol: POSITIVE — AB

## 2017-09-10 LAB — GLUCOSE, CAPILLARY
GLUCOSE-CAPILLARY: 190 mg/dL — AB (ref 65–99)
GLUCOSE-CAPILLARY: 151 mg/dL — AB (ref 65–99)
Glucose-Capillary: 134 mg/dL — ABNORMAL HIGH (ref 65–99)
Glucose-Capillary: 132 mg/dL — ABNORMAL HIGH (ref 65–99)
Glucose-Capillary: 149 mg/dL — ABNORMAL HIGH (ref 65–99)
Glucose-Capillary: 187 mg/dL — ABNORMAL HIGH (ref 65–99)
Glucose-Capillary: 167 mg/dL — ABNORMAL HIGH (ref 65–99)

## 2017-09-10 LAB — HIV ANTIBODY (ROUTINE TESTING W REFLEX): HIV Screen 4th Generation wRfx: NONREACTIVE

## 2017-09-10 MED ORDER — ALBUTEROL (5 MG/ML) CONTINUOUS INHALATION SOLN: 15.0000 mg/h | INHALATION_SOLUTION | RESPIRATORY_TRACT | Status: DC

## 2017-09-10 MED ORDER — SODIUM CHLORIDE 0.9 % IV SOLN
INTRAVENOUS | Status: DC | PRN
  Administered 2017-09-10: 10 mL/h via INTRA_ARTERIAL

## 2017-09-10 MED ORDER — POTASSIUM CHLORIDE 20 MEQ/15ML (10%) PO SOLN
40.0000 meq | Freq: Once | ORAL | Status: AC
  Administered 2017-09-10: 40 meq
  Filled 2017-09-10: qty 30

## 2017-09-10 MED ORDER — SODIUM BICARBONATE 8.4 % IV SOLN
INTRAVENOUS | Status: AC
  Filled 2017-09-10: qty 100

## 2017-09-10 MED ORDER — INSULIN ASPART 100 UNIT/ML ~~LOC~~ SOLN
0.0000 [IU] | SUBCUTANEOUS | Status: DC
  Administered 2017-09-10 (×2): 3 [IU] via SUBCUTANEOUS
  Administered 2017-09-11 – 2017-09-14 (×16): 2 [IU] via SUBCUTANEOUS

## 2017-09-10 MED ORDER — ALBUTEROL (5 MG/ML) CONTINUOUS INHALATION SOLN
15.0000 mg/h | INHALATION_SOLUTION | RESPIRATORY_TRACT | Status: AC
  Administered 2017-09-10: 15 mg/h via RESPIRATORY_TRACT
  Filled 2017-09-10 (×2): qty 20

## 2017-09-10 MED ORDER — SODIUM CHLORIDE 0.9 % IV SOLN
0.0000 ug/min | INTRAVENOUS | Status: DC
  Administered 2017-09-10: 45 ug/min via INTRAVENOUS
  Administered 2017-09-10: 35 ug/min via INTRAVENOUS
  Administered 2017-09-10: 80 ug/min via INTRAVENOUS
  Administered 2017-09-10: 110 ug/min via INTRAVENOUS
  Administered 2017-09-10: 60 ug/min via INTRAVENOUS
  Filled 2017-09-10: qty 1
  Filled 2017-09-10 (×2): qty 10
  Filled 2017-09-10: qty 1
  Filled 2017-09-10: qty 10
  Filled 2017-09-10 (×2): qty 1

## 2017-09-10 MED ORDER — INSULIN ASPART 100 UNIT/ML ~~LOC~~ SOLN: 0.0000 [IU] | Freq: Three times a day (TID) | SUBCUTANEOUS | Status: DC

## 2017-09-10 MED ORDER — ORAL CARE MOUTH RINSE
15.0000 mL | OROMUCOSAL | Status: DC
  Administered 2017-09-10 – 2017-09-16 (×61): 15 mL via OROMUCOSAL

## 2017-09-10 MED ORDER — CHLORHEXIDINE GLUCONATE 0.12% ORAL RINSE (MEDLINE KIT)
15.0000 mL | Freq: Two times a day (BID) | OROMUCOSAL | Status: DC
  Administered 2017-09-10 – 2017-09-16 (×13): 15 mL via OROMUCOSAL

## 2017-09-10 MED ORDER — SODIUM BICARBONATE 8.4 % IV SOLN
INTRAVENOUS | Status: AC
  Administered 2017-09-10: 200 meq via INTRAVENOUS
  Filled 2017-09-10: qty 100

## 2017-09-10 MED ORDER — SODIUM BICARBONATE 8.4 % IV SOLN
200.0000 meq | Freq: Once | INTRAVENOUS | Status: AC
  Administered 2017-09-10: 200 meq via INTRAVENOUS

## 2017-09-10 NOTE — Progress Notes (Signed)
Ketamine 27ml's wasted in sink witnessed by Neita Goodnight, Charity fundraiser.

## 2017-09-10 NOTE — Progress Notes (Signed)
This note also relates to the following rows which could not be included: SpO2 - Cannot attach notes to unvalidated device data  Ventilator changes made per MD. Mode now PC with following settings: PC 35, RR 30, PEEP 5, and 40% FIO2. Vitals are stable. ABG will be drawn in 30 minutes. RT will continue to monitor.

## 2017-09-10 NOTE — Progress Notes (Signed)
Called CCM to discuss patient status and concerns.  Patient was non compliant with current vent settings, anuric, labile pressures.  Dr Warnell Forester came to evaluate patient and aggressively intervened to improve oxygenation. Patients respiratory status continued to decompensate and was decided to transfer to [facility] for additional therapy.

## 2017-09-10 NOTE — Progress Notes (Addendum)
PULMONARY / CRITICAL CARE MEDICINE   Name: Jeremy Mccormick MRN: [id number] DOB: [birth year]    ADMISSION DATE:  09/09/2017 CONSULTATION DATE:  09/09/2017  REFERRING MD:  Dr. Erma Heritage  CHIEF COMPLAINT:  Wheezing and Shortness of Breath  HISTORY OF PRESENT ILLNESS:   Mr. Mankey is a 32 y/o male with a PMH of asthma.  He presents to [facility] ED on 5/6 with c/o acute onset of wheezing, shortness of breath, and chest tightness.  He began to have complaints of expiratory wheezing and productive cough with clear sputum since 5/5.  It was noted that he has been out of his inhalers .  Pt is currently intubated and sedated, with no family present, therefore history is obtained from ED notes.  In the ED pt received continuous Albuterol treatment, magnesium IV, Solu-Medrol IV, and Epinephrine, however continued to experience respiratory distress.  Trial of BiPap was not tolerated, and pt was subsequently intubated in the ED.  CXR is not concerning for any acute Pulmonary disease.   PCCM is consulted for admission for Acute Hypercapnic Respiratory Failure secondary to Asthma exacerbation requiring mechanical ventilation.  SUBJECTIVE:  Heavily sedated under neuromuscular blockade  VITAL SIGNS: BP 114/69   Pulse (!) 107   Temp 98.3 F (36.8 C) (Oral)   Resp 20   Ht 5' 7.01" (1.702 m)   Wt 66.1 kg (145 lb 11.6 oz)   SpO2 100%   BMI 22.82 kg/m   HEMODYNAMICS:    VENTILATOR SETTINGS: Vent Mode: PRVC FiO2 (%):  [30 %-100 %] 50 % Set Rate:  [10 bmp-20 bmp] 20 bmp Vt Set:  [350 mL-600 mL] 400 mL PEEP:  [5 cmH20-10 cmH20] 10 cmH20 Plateau Pressure:  [28 cmH20-37 cmH20] 28 cmH20  INTAKE / OUTPUT: I/O last 3 completed shifts: In: 4034.3 [I.V.:4024.3; Other:10] Out: 1200 [Urine:1200]  PHYSICAL EXAMINATION: General: Thin young male currently under neuromuscular blockade heavy sedation. HEENT: Tracheal tube to ventilator, OG tube Neuro: Currently under neuromuscular blockade CV: s1s2 rrr, no  m/r/g PULM: even/non-labored, lungs bilaterally expiratory and expiratory wheezes WG:NFAO, non-tender, bsx4 active  Extremities: warm/dry, negative edema  Skin: no rashes or lesions   LABS:  BMET Recent Labs  Lab 09/09/17 1252  NA 141  K 3.4*  CL 108  CO2 21*  BUN 12  CREATININE 1.30*  GLUCOSE 101*    Electrolytes Recent Labs  Lab 09/09/17 1252  CALCIUM 9.4    CBC Recent Labs  Lab 09/09/17 1252  WBC 8.4  HGB 17.2*  HCT 48.7  PLT 283    Coag's No results for input(s): APTT, INR in the last 168 hours.  Sepsis Markers No results for input(s): LATICACIDVEN, PROCALCITON, O2SATVEN in the last 168 hours.  ABG Recent Labs  Lab 09/09/17 2142 09/10/17 0014 09/10/17 0253  PHART 7.086* 7.098* 7.046*  PCO2ART 59.8* 81.3* 108*  PO2ART 127* 327* 104    Liver Enzymes No results for input(s): AST, ALT, ALKPHOS, BILITOT, ALBUMIN in the last 168 hours.  Cardiac Enzymes No results for input(s): TROPONINI, PROBNP in the last 168 hours.  Glucose Recent Labs  Lab 09/09/17 1717 09/09/17 1956 09/09/17 2258 09/10/17 0514 09/10/17 0806  GLUCAP 123* 134* 132* 149* 190*    Imaging Dg Chest Port 1 View  Result Date: 09/10/2017 CLINICAL DATA:  32 y/o  M; status asthmaticus. EXAM: PORTABLE CHEST 1 VIEW COMPARISON:  09/09/2017 chest radiograph FINDINGS: Stable endotracheal tube and enteric tube. Clear lungs. No pneumothorax or pleural effusion. Stable normal cardiac silhouette. Bones are  unremarkable. IMPRESSION: Stable endotracheal and enteric tubes. No acute pulmonary process identified. Electronically Signed   By: Mitzi Hansen M.D.   On: 09/10/2017 00:48   Dg Chest Portable 1 View  Result Date: 09/09/2017 CLINICAL DATA:  Endotracheal and enteric tube placement. EXAM: PORTABLE CHEST 1 VIEW COMPARISON:  Chest x-ray dated February 01, 2017. FINDINGS: Endotracheal tube in place with the tip approximately 4.5 cm above the level of the carina. Enteric tube seen  entering the stomach with the tip below the field of view. The heart size and mediastinal contours are within normal limits. Normal pulmonary vascularity. No focal consolidation, pleural effusion, or pneumothorax. No acute osseous abnormality. IMPRESSION: 1. Appropriately positioned endotracheal tube. Enteric tube entering the stomach with the tip below the field of view. 2.  No active cardiopulmonary disease. Electronically Signed   By: Obie Dredge M.D.   On: 09/09/2017 14:38     STUDIES:  None 09/10/2017 UDS  CULTURES: None  ANTIBIOTICS: None  SIGNIFICANT EVENTS: 5/6>> Presents to [facility] ED with Acute Hypercapnic Respiratory distress requiring Mechanical Ventilation and Admission to [facility] ICU  LINES/TUBES: 5/6>>ETT 5/6>> Foley catheter  DISCUSSION: This is a 32 y/o male with Acute Hypercapnic Respiratory failure secondary to Acute Asthma exacerbation requiring mechanical ventilation.  09/10/2017 transferred to [facility] for with long hospital.  Difficult air trapping despite neuromuscular blockade and heavy sedation multiple changes made to ventilator.  ASSESSMENT / PLAN:  PULMONARY A: Acute Hypercapnic Respiratory failure secondary to Asthma Exacerbation Hx: Asthma  P:   Full vent support Change to pressure control 30 with 5 of PEEP and increased respiratory rate 28 we will repeat ABG 30 minutes.  He has a refractory acidosis despite being on bicarb increased respiratory rate and attempt to equilibrate. Solu-Medrol ABG frequently Daily chest x-ray Sedation as needed  CARDIOVASCULAR A:  Acute HTN, likely in setting of acute agitation and intubation  P:  Telemetry monitoring Reassess BP once sedated, prn Hydralazine if needed for SBP >170 or DBP >100  RENAL Lab Results  Component Value Date   CREATININE 1.30 (H) 09/09/2017   CREATININE 1.39 (H) 01/25/2017   CREATININE 1.09 07/08/2016   Recent Labs  Lab 09/09/17 1252  K 3.4*    A:    Acute Kidney Injury Hypokalemia Severe respiratory acidosis  P:   Replace K NS @ 75 ml/hr Monitor urine output Follow BMP Bicarb drip 3 A 1 L at 150,  GASTROINTESTINAL A:   No acute issues  P:   Keep NPO for now Pepcid for SUP  HEMATOLOGIC Recent Labs    09/09/17 1252  HGB 17.2*    A:   No acute issues  P:  Transfuse per protocol DVT prophylaxis with subcu heparin  INFECTIOUS A:   No acute issues  P:   Monitor fever curve and WBC Currently not on antibiotics  ENDOCRINE A:   CBG (last 3)  Recent Labs    09/09/17 2258 09/10/17 0514 09/10/17 0806  GLUCAP 132* 149* 190*    No acute issues    P:   Sliding scale insulin May need coverage due to steroid use.  Currently on Solu-Medrol 60 mg IV every 6 with rising glucose of 190 noted  NEUROLOGIC A:   Acute Encephalopathy likely in setting of Hypercapnia and sedation  P:   RA SS score is -2 Fentanyl, propofol, ketamine for sedation.  We will DC ketamine at this time since he is on high-dose fentanyl propofol and neuromuscular blockade  no need for ketamine. Continue for neuromuscular blockade being monitored with train-of-four currently on the cornea.  Note may consider changes to Nimbex in the near future if we cannot properly monitor the vecuronium drip.  FAMILY  - Updates: 09/10/2017 mother updated at the bedside.  - Inter-disciplinary family meet or Palliative Care meeting due by:  09/16/17  App cct 45 min  Brett Canales Minor ACNP Adolph Pollack PCCM Pager [phone] till 1 pm If no answer page [phone]  Attending Note:  32 year old male with PMH of asthma presenting in acute respiratory failure from asthma exacerbation who was retaining significantly and in severe respiratory acidosis.  On exam, severe wheezing noted.  I reviewed CXR myself, no infiltrate noted.  Discussed with PCCM-NP.  Vent adjusted to address acidosis and air trapping.  Repeat ABG after adjustment to PCV showed improvement in pH to  7.21 from 7.0.  Will continue abx and steroids.  F/U ABG and CXR in AM.  F/U on cultures.  PCCM will continue to follow.  The patient is critically ill with multiple organ systems failure and requires high complexity decision making for assessment and support, frequent evaluation and titration of therapies, application of advanced monitoring technologies and extensive interpretation of multiple databases.   Critical Care Time devoted to patient care services described in this note is  36  Minutes. This time reflects time of care of this signee Dr [name]. This critical care time does not reflect procedure time, or teaching time or supervisory time of PA/NP/Med student/Med Resident etc but could involve care discussion time.  Alyson Reedy, M.D. [facility] Pulmonary/Critical Care Medicine. Pager: [phone]. After hours pager: [phone].  09/10/2017, 10:09 AM

## 2017-09-11 DIAGNOSIS — R579 Shock, unspecified: Secondary | ICD-10-CM

## 2017-09-11 LAB — URINALYSIS, ROUTINE W REFLEX MICROSCOPIC
BACTERIA UA: NONE SEEN
Bilirubin Urine: NEGATIVE
Glucose, UA: NEGATIVE mg/dL
KETONES UR: NEGATIVE mg/dL
Leukocytes, UA: NEGATIVE
Nitrite: NEGATIVE
PROTEIN: 30 mg/dL — AB
Specific Gravity, Urine: 1.01 (ref 1.005–1.030)
pH: 9 — ABNORMAL HIGH (ref 5.0–8.0)

## 2017-09-11 LAB — POCT I-STAT 3, ART BLOOD GAS (G3+)
Acid-Base Excess: 16 mmol/L — ABNORMAL HIGH (ref 0.0–2.0)
BICARBONATE: 38.7 mmol/L — AB (ref 20.0–28.0)
O2 SAT: 95 %
PCO2 ART: 39.9 mmHg (ref 32.0–48.0)
PH ART: 7.594 — AB (ref 7.350–7.450)
Patient temperature: 98.4
TCO2: 40 mmol/L — AB (ref 22–32)
pO2, Arterial: 62 mmHg — ABNORMAL LOW (ref 83.0–108.0)

## 2017-09-11 LAB — GLUCOSE, CAPILLARY
GLUCOSE-CAPILLARY: 122 mg/dL — AB (ref 65–99)
GLUCOSE-CAPILLARY: 125 mg/dL — AB (ref 65–99)
GLUCOSE-CAPILLARY: 131 mg/dL — AB (ref 65–99)
GLUCOSE-CAPILLARY: 143 mg/dL — AB (ref 65–99)
Glucose-Capillary: 113 mg/dL — ABNORMAL HIGH (ref 65–99)
Glucose-Capillary: 115 mg/dL — ABNORMAL HIGH (ref 65–99)
Glucose-Capillary: 132 mg/dL — ABNORMAL HIGH (ref 65–99)

## 2017-09-11 LAB — BASIC METABOLIC PANEL
Anion gap: 7 (ref 5–15)
BUN: 20 mg/dL (ref 6–20)
CHLORIDE: 102 mmol/L (ref 101–111)
CO2: 38 mmol/L — AB (ref 22–32)
CREATININE: 2.19 mg/dL — AB (ref 0.61–1.24)
Calcium: 7.5 mg/dL — ABNORMAL LOW (ref 8.9–10.3)
GFR calc Af Amer: 45 mL/min — ABNORMAL LOW (ref >60)
GFR calc non Af Amer: 39 mL/min — ABNORMAL LOW (ref >60)
Glucose, Bld: 123 mg/dL — ABNORMAL HIGH (ref 65–99)
Potassium: 3 mmol/L — ABNORMAL LOW (ref 3.5–5.1)
Sodium: 147 mmol/L — ABNORMAL HIGH (ref 135–145)

## 2017-09-11 LAB — MAGNESIUM: Magnesium: 3 mg/dL — ABNORMAL HIGH (ref 1.7–2.4)

## 2017-09-11 LAB — PHOSPHORUS: PHOSPHORUS: 1.7 mg/dL — AB (ref 2.5–4.6)

## 2017-09-11 MED ORDER — CISATRACURIUM BOLUS VIA INFUSION
2.5000 mg | Freq: Once | INTRAVENOUS | Status: AC
  Administered 2017-09-11: 2.5 mg via INTRAVENOUS
  Filled 2017-09-11: qty 3

## 2017-09-11 MED ORDER — SODIUM CHLORIDE 0.9 % IV SOLN
2.0000 g | Freq: Two times a day (BID) | INTRAVENOUS | Status: DC
  Administered 2017-09-11 – 2017-09-13 (×5): 2 g via INTRAVENOUS
  Filled 2017-09-11 (×5): qty 2

## 2017-09-11 MED ORDER — VANCOMYCIN HCL 10 G IV SOLR
1250.0000 mg | INTRAVENOUS | Status: DC
  Administered 2017-09-12: 1250 mg via INTRAVENOUS
  Filled 2017-09-11 (×3): qty 1250

## 2017-09-11 MED ORDER — POTASSIUM PHOSPHATES 15 MMOLE/5ML IV SOLN
30.0000 mmol | Freq: Once | INTRAVENOUS | Status: AC
  Administered 2017-09-11: 30 mmol via INTRAVENOUS
  Filled 2017-09-11: qty 10

## 2017-09-11 MED ORDER — SODIUM CHLORIDE 0.9 % IV SOLN
3.0000 ug/kg/min | INTRAVENOUS | Status: DC
  Administered 2017-09-11: 3 ug/kg/min via INTRAVENOUS
  Administered 2017-09-12: 2.5 ug/kg/min via INTRAVENOUS
  Administered 2017-09-13: 3 ug/kg/min via INTRAVENOUS
  Filled 2017-09-11 (×2): qty 20

## 2017-09-11 MED ORDER — VITAL HIGH PROTEIN PO LIQD
1000.0000 mL | ORAL | Status: DC
  Administered 2017-09-11: 1000 mL
  Administered 2017-09-12: 55 mL/h
  Administered 2017-09-12 – 2017-09-13 (×3): 1000 mL

## 2017-09-11 MED ORDER — SODIUM CHLORIDE 0.9 % IV SOLN
INTRAVENOUS | Status: DC
  Administered 2017-09-11 – 2017-09-12 (×2): via INTRAVENOUS

## 2017-09-11 MED ORDER — PIPERACILLIN-TAZOBACTAM 3.375 G IVPB: 3.3750 g | Freq: Three times a day (TID) | INTRAVENOUS | Status: DC

## 2017-09-11 MED ORDER — PIPERACILLIN-TAZOBACTAM 3.375 G IVPB 30 MIN
3.3750 g | Freq: Once | INTRAVENOUS | Status: DC
  Filled 2017-09-11: qty 50

## 2017-09-11 MED ORDER — VANCOMYCIN HCL 10 G IV SOLR
1500.0000 mg | Freq: Once | INTRAVENOUS | Status: AC
  Administered 2017-09-11: 1500 mg via INTRAVENOUS
  Filled 2017-09-11: qty 1500

## 2017-09-11 MED ORDER — PROPOFOL 1000 MG/100ML IV EMUL: 25.0000 ug/kg/min | INTRAVENOUS | Status: DC

## 2017-09-11 NOTE — Progress Notes (Signed)
PULMONARY / CRITICAL CARE MEDICINE   Name: Jeremy Mccormick MRN: [id number] DOB: 08-19-1985    ADMISSION DATE:  09/09/2017 CONSULTATION DATE:  09/09/2017  REFERRING MD:  Jeremy Mccormick  CHIEF COMPLAINT:  Wheezing and Shortness of Breath  HISTORY OF PRESENT ILLNESS:   Mr. Jeremy Mccormick is a 32 y/o male with a PMH of asthma.  He presents to [facility] ED on 5/6 with c/o acute onset of wheezing, shortness of breath, and chest tightness.  He began to have complaints of expiratory wheezing and productive cough with clear sputum since 5/5.  It was noted that he has been out of his inhalers .  Pt is currently intubated and sedated, with no family present, therefore history is obtained from ED notes.  In the ED pt received continuous Albuterol treatment, magnesium IV, Solu-Medrol IV, and Epinephrine, however continued to experience respiratory distress.  Trial of BiPap was not tolerated, and pt was subsequently intubated in the ED.  CXR is not concerning for any acute Pulmonary disease.   PCCM is consulted for admission for Acute Hypercapnic Respiratory Failure secondary to Asthma exacerbation requiring mechanical ventilation.  SUBJECTIVE:  Sedated and paralyzed, hypoxemia overnight, ABG improved  VITAL SIGNS: BP (!) 136/45   Pulse 100   Temp 98.4 F (36.9 C) (Oral)   Resp (!) 30   Ht 5' 7.01" (1.702 m)   Wt 155 lb 13.8 oz (70.7 kg)   SpO2 90%   BMI 24.41 kg/m   HEMODYNAMICS:    VENTILATOR SETTINGS: Vent Mode: PCV FiO2 (%):  [30 %-40 %] 40 % Set Rate:  [30 bmp] 30 bmp PEEP:  [5 cmH20] 5 cmH20 Plateau Pressure:  [23 cmH20-26 cmH20] 24 cmH20  INTAKE / OUTPUT: I/O last 3 completed shifts: In: 11005.7 [I.V.:10835.7; Other:10; NG/GT:160] Out: 4780 [Urine:4530; Emesis/NG output:250]  PHYSICAL EXAMINATION: General: acutely ill appearing male, NAD HEENT: [place]/AT, PERRL, no EOM and MMM Neuro: Vecuronium drip, unable to assess CV: RRR, Nl S1/S2 and -M/R/G. PULM: Wheezes on the right not left GI: Soft,  NT, ND and +BS Extremities: warm/dry, negative edema  Skin: no rashes or lesions  LABS:  BMET Recent Labs  Lab 09/09/17 1252 09/11/17 0605  NA 141 147*  K 3.4* 3.0*  CL 108 102  CO2 21* 38*  BUN 12 20  CREATININE 1.30* 2.19*  GLUCOSE 101* 123*   Electrolytes Recent Labs  Lab 09/09/17 1252 09/11/17 0605  CALCIUM 9.4 7.5*  MG  --  3.0*  PHOS  --  1.7*   CBC Recent Labs  Lab 09/09/17 1252  WBC 8.4  HGB 17.2*  HCT 48.7  PLT 283   Coag's No results for input(s): APTT, INR in the last 168 hours.  Sepsis Markers No results for input(s): LATICACIDVEN, PROCALCITON, O2SATVEN in the last 168 hours.  ABG Recent Labs  Lab 09/10/17 1016 09/10/17 1120 09/10/17 1305  PHART 7.143* 7.211* 7.394  PCO2ART 85.7* 73.8* 41.1  PO2ART 216.0* 162.0* 72.8*    Liver Enzymes No results for input(s): AST, ALT, ALKPHOS, BILITOT, ALBUMIN in the last 168 hours.  Cardiac Enzymes No results for input(s): TROPONINI, PROBNP in the last 168 hours.  Glucose Recent Labs  Lab 09/10/17 1222 09/10/17 1606 09/10/17 1952 09/11/17 0006 09/11/17 0346 09/11/17 0743  GLUCAP 187* 167* 151* 122* 115* 113*    Imaging No results found.   STUDIES:  None 09/10/2017 UDS  CULTURES: None  ANTIBIOTICS: None  SIGNIFICANT EVENTS: 5/6>> Presents to [facility] ED with Acute Hypercapnic Respiratory distress requiring  Mechanical Ventilation and Admission to [facility] ICU  LINES/TUBES: 5/6>>ETT 5/6>> Foley catheter  DISCUSSION: This is a 32 y/o male with Acute Hypercapnic Respiratory failure secondary to Acute Asthma exacerbation requiring mechanical ventilation.  09/10/2017 transferred to [facility] for with long hospital.  Difficult air trapping despite neuromuscular blockade and heavy sedation multiple changes made to ventilator.  ASSESSMENT / PLAN:  PULMONARY A: Acute Hypercapnic Respiratory failure secondary to Asthma Exacerbation Hx: Asthma  P:   Full vent  support Maintain paralyzed Bronch today for unilateral wheezing, insure no foreign body is present Solu-Medrol ABG frequently Daily chest x-ray Sedation as needed Adjust vent for ABG, to PCV  CARDIOVASCULAR A:  Acute HTN, likely in setting of acute agitation and intubation  P:  Telemetry monitoring Neo as need for BP support if BP drops again  RENAL Lab Results  Component Value Date   CREATININE 2.19 (H) 09/11/2017   CREATININE 1.30 (H) 09/09/2017   CREATININE 1.39 (H) 01/25/2017   Recent Labs  Lab 09/09/17 1252 09/11/17 0605  K 3.4* 3.0*   A:   Acute Kidney Injury Hypokalemia Severe respiratory acidosis  P:   Replace K3PO4 NS to 100 ml/hr D/C Bicarb drip Monitor urine output Follow BMP Replace electrolytes as indicated   GASTROINTESTINAL A:   No acute issues  P:   Keep NPO for now Pepcid for SUP TF per nutrition  HEMATOLOGIC Recent Labs    09/09/17 1252  HGB 17.2*   A:   No acute issues  P:  Transfuse per protocol SQ heparin for DVT prophylaxis CBC in AM  INFECTIOUS A:   No acute issues  P:   Monitor fever curve and WBC Currently not on antibiotics  ENDOCRINE A:   CBG (last 3)  Recent Labs    09/11/17 0006 09/11/17 0346 09/11/17 0743  GLUCAP 122* 115* 113*   No acute issues    P:   Sliding scale insulin CBGs while on steroids  NEUROLOGIC A:   Acute Encephalopathy likely in setting of Hypercapnia and sedation  P:   RA SS score is -5 Propofol and fentanyl drips Change vec to nimbex while asynchroncy is an issue  FAMILY  - Updates: mother contacted for consent for bronch but no response.  - Inter-disciplinary family meet or Palliative Care meeting due by:  09/16/17  The patient is critically ill with multiple organ systems failure and requires high complexity decision making for assessment and support, frequent evaluation and titration of therapies, application of advanced monitoring technologies and extensive  interpretation of multiple databases.   Critical Care Time devoted to patient care services described in this note is  39  Minutes. This time reflects time of care of this signee Dr Jeremy Mccormick. This critical care time does not reflect procedure time, or teaching time or supervisory time of PA/NP/Med student/Med Resident etc but could involve care discussion time.  Alyson Reedy, M.D. [facility] Pulmonary/Critical Care Medicine. Pager: [phone]. After hours pager: [phone].  09/11/2017, 9:32 AM

## 2017-09-11 NOTE — Progress Notes (Addendum)
Initial Nutrition Assessment  DOCUMENTATION CODES:   Not applicable  INTERVENTION:    Vital High Protein at 55 ml/h (1320 ml per day)  Provides 1320 kcal (2025 kcal total with Propofol), 116 gm protein, 1104 ml free water daily  NUTRITION DIAGNOSIS:   Inadequate oral intake related to inability to eat as evidenced by NPO status.  GOAL:   Patient will meet greater than or equal to 90% of their needs  MONITOR:   Vent status, TF tolerance, Labs, I & O's  REASON FOR ASSESSMENT:   Ventilator, Consult Enteral/tube feeding initiation and management  ASSESSMENT:   32 yo male with PMH of asthma, marijuana use, and former smoker who was admitted on 5/6 with SOB requiring intubation in the ED.  Discussed patient with RN today. Received MD Consult for TF initiation and management. OGT in place Patient is currently intubated on ventilator support MV: 13.5 L/min Temp (24hrs), Avg:98.5 F (36.9 C), Min:98 F (36.7 C), Max:98.9 F (37.2 C)  Propofol: 26.7 ml/hr providing 705 kcal from lipid Labs reviewed. Sodium 147 (H), potassium 3 (L), phosphorus 1.7 (L), magnesium 3 (H) CBG's: 113-125 Medications reviewed and include Zantac, Solumedrol, Nimbex, potassium phosphate, and propofol.   NUTRITION - FOCUSED PHYSICAL EXAM:    Most Recent Value  Orbital Region  No depletion  Upper Arm Region  No depletion  Thoracic and Lumbar Region  Unable to assess  Buccal Region  Unable to assess  Temple Region  No depletion  Clavicle Bone Region  No depletion  Clavicle and Acromion Bone Region  No depletion  Scapular Bone Region  Unable to assess  Dorsal Hand  No depletion  Patellar Region  No depletion  Anterior Thigh Region  No depletion  Posterior Calf Region  No depletion  Edema (RD Assessment)  None  Hair  Reviewed  Eyes  Unable to assess  Mouth  Unable to assess  Skin  Reviewed  Nails  Reviewed       Diet Order:   Diet Order           Diet NPO time specified  Diet  effective now          EDUCATION NEEDS:   No education needs have been identified at this time  Skin:  Skin Assessment: Reviewed RN Assessment  Last BM:  unknown  Height:   Ht Readings from Last 1 Encounters:  09/09/17 5' 7.01" (1.702 m)    Weight:   Wt Readings from Last 1 Encounters:  09/11/17 155 lb 13.8 oz (70.7 kg)    Ideal Body Weight:  67.3 kg  BMI:  Body mass index is 24.41 kg/m.  Estimated Nutritional Needs:   Kcal:  1980  Protein:  105-120 gm  Fluid:  2 L    Joaquin Courts, RD, LDN, CNSC Pager [phone] After Hours Pager [phone]

## 2017-09-11 NOTE — Progress Notes (Signed)
Pharmacy Antibiotic Note  Jeremy Mccormick is a 32 y.o. male admitted on 09/09/2017 with asthma exacerbation. Remains intubated and paralyzed. Bronchoscopy today showed purulent secretions in bibasilar airways. Planning to start empiric antimicrobials. Pt also in AKI, SCr 1 > 2.1, eCrCl 40-50 ml/min.   Plan: -Vancomycin 1500 mg IV x1 then 1250 mg IV q24h -Cefepime 2 g IV q12h -Monitor renal fx, cultures, VT at Css   Height: 5' 7.01" (170.2 cm) Weight: 155 lb 13.8 oz (70.7 kg) IBW/kg (Calculated) : 66.12  Temp (24hrs), Avg:98.5 F (36.9 C), Min:98 F (36.7 C), Max:98.9 F (37.2 C)  Recent Labs  Lab 09/09/17 1252 09/11/17 0605  WBC 8.4  --   CREATININE 1.30* 2.19*     Antimicrobials this admission: 5/8 vancomycin > 5/8 cefepime >  Dose adjustments this admission: N/A  Microbiology results: 5/6 mrsa: neg 5/8 blood cx: 5/8 TA:  Baldemar Friday 09/11/2017 10:52 AM

## 2017-09-11 NOTE — ED Provider Notes (Signed)
Procedure Name: Intubation Date/Time: 09/11/2017 1:14 PM Performed by: Duffy Bruce, MD Pre-anesthesia Checklist: Patient identified, Patient being monitored, Emergency Drugs available, Timeout performed and Suction available Oxygen Delivery Method: Non-rebreather mask Preoxygenation: Pre-oxygenation with 100% oxygen Induction Type: Rapid sequence Ventilation: Mask ventilation without difficulty Laryngoscope Size: Mac and 4 Grade View: Grade I Tube size: 8.0 mm Number of attempts: 1 Airway Equipment and Method: Rigid stylet and Video-laryngoscopy Placement Confirmation: ETT inserted through vocal cords under direct vision,  CO2 detector and Breath sounds checked- equal and bilateral Secured at: 23 cm Tube secured with: ETT holder Dental Injury: Teeth and Oropharynx as per pre-operative assessment  Difficulty Due To: Difficulty was unanticipated Future Recommendations: Recommend- induction with short-acting agent, and alternative techniques readily available    .Critical Care Performed by: Duffy Bruce, MD Authorized by: Duffy Bruce, MD   Critical care provider statement:    Critical care time (minutes):  75   Critical care time was exclusive of:  Separately billable procedures and treating other patients and teaching time   Critical care was necessary to treat or prevent imminent or life-threatening deterioration of the following conditions:  Circulatory failure, cardiac failure and respiratory failure   Critical care was time spent personally by me on the following activities:  Development of treatment plan with patient or surrogate, discussions with consultants, evaluation of patient's response to treatment, examination of patient, obtaining history from patient or surrogate, ordering and performing treatments and interventions, ordering and review of laboratory studies, ordering and review of radiographic studies, pulse oximetry, re-evaluation of patient's condition and  review of old charts   I assumed direction of critical care for this patient from another provider in my specialty: no        Duffy Bruce, MD 09/11/17 1315

## 2017-09-11 NOTE — Procedures (Signed)
Bronchoscopy Procedure Note Jeremy Mccormick [id number] 10/27/1985  Procedure: Bronchoscopy Indications: Diagnostic evaluation of the airways and Obtain specimens for culture and/or other diagnostic studies  Procedure Details Consent: Risks of procedure as well as the alternatives and risks of each were explained to the (patient/caregiver).  Consent for procedure obtained. Time Out: Verified patient identification, verified procedure, site/side was marked, verified correct patient position, special equipment/implants available, medications/allergies/relevent history reviewed, required imaging and test results available.  Performed  In preparation for procedure, patient was given 100% FiO2 and bronchoscope lubricated. Sedation: Benzodiazepines, Muscle relaxants and Fentanyl  Airway entered and the following bronchi were examined: RUL, RML, RLL, LUL, LLL and Bronchi.   Bibasilar purulent secretion noted, no masses or foreign bodies Bronchoscope removed.    Evaluation Hemodynamic Status: BP stable throughout; O2 sats: stable throughout Patient's Current Condition: stable Specimens:  Sent purulent fluid Complications: No apparent complications Patient did tolerate procedure well.   [name] 09/11/2017

## 2017-09-12 ENCOUNTER — Inpatient Hospital Stay (HOSPITAL_COMMUNITY): Payer: Self-pay

## 2017-09-12 LAB — URINE CULTURE: CULTURE: NO GROWTH

## 2017-09-12 LAB — BASIC METABOLIC PANEL
ANION GAP: 7 (ref 5–15)
BUN: 28 mg/dL — ABNORMAL HIGH (ref 6–20)
CALCIUM: 8 mg/dL — AB (ref 8.9–10.3)
CO2: 29 mmol/L (ref 22–32)
CREATININE: 1.76 mg/dL — AB (ref 0.61–1.24)
Chloride: 108 mmol/L (ref 101–111)
GFR calc non Af Amer: 50 mL/min — ABNORMAL LOW (ref >60)
GFR, EST AFRICAN AMERICAN: 58 mL/min — AB (ref >60)
GLUCOSE: 123 mg/dL — AB (ref 65–99)
Potassium: 4.7 mmol/L (ref 3.5–5.1)
Sodium: 144 mmol/L (ref 135–145)

## 2017-09-12 LAB — GLUCOSE, CAPILLARY
GLUCOSE-CAPILLARY: 120 mg/dL — AB (ref 65–99)
Glucose-Capillary: 122 mg/dL — ABNORMAL HIGH (ref 65–99)
Glucose-Capillary: 127 mg/dL — ABNORMAL HIGH (ref 65–99)
Glucose-Capillary: 127 mg/dL — ABNORMAL HIGH (ref 65–99)
Glucose-Capillary: 123 mg/dL — ABNORMAL HIGH (ref 65–99)

## 2017-09-12 LAB — POCT I-STAT 3, ART BLOOD GAS (G3+)
ACID-BASE EXCESS: 3 mmol/L — AB (ref 0.0–2.0)
Bicarbonate: 27.4 mmol/L (ref 20.0–28.0)
O2 Saturation: 98 %
TCO2: 29 mmol/L (ref 22–32)
pCO2 arterial: 40.3 mmHg (ref 32.0–48.0)
pH, Arterial: 7.442 (ref 7.350–7.450)
pO2, Arterial: 99 mmHg (ref 83.0–108.0)

## 2017-09-12 LAB — TRIGLYCERIDES: TRIGLYCERIDES: 49 mg/dL (ref <150)

## 2017-09-12 LAB — CBC
HCT: 35.9 % — ABNORMAL LOW (ref 39.0–52.0)
Hemoglobin: 11.4 g/dL — ABNORMAL LOW (ref 13.0–17.0)
MCH: 31.5 pg (ref 26.0–34.0)
MCHC: 31.8 g/dL (ref 30.0–36.0)
MCV: 99.2 fL (ref 78.0–100.0)
PLATELETS: 179 10*3/uL (ref 150–400)
RBC: 3.62 MIL/uL — AB (ref 4.22–5.81)
RDW: 13.5 % (ref 11.5–15.5)
WBC: 12.2 10*3/uL — AB (ref 4.0–10.5)

## 2017-09-12 LAB — MAGNESIUM: Magnesium: 2.9 mg/dL — ABNORMAL HIGH (ref 1.7–2.4)

## 2017-09-12 LAB — PHOSPHORUS: Phosphorus: 2.6 mg/dL (ref 2.5–4.6)

## 2017-09-12 NOTE — Progress Notes (Signed)
PULMONARY / CRITICAL CARE MEDICINE   Name: Jeremy Mccormick MRN: [id number] DOB: 26-Aug-1985    ADMISSION DATE:  09/09/2017 CONSULTATION DATE:  09/09/2017  REFERRING MD:  Dr. Erma Heritage  CHIEF COMPLAINT:  Wheezing and Shortness of Breath  HISTORY OF PRESENT ILLNESS:   Jeremy Mccormick is a 32 y/o male with a PMH of asthma.  He presents to [facility] ED on 5/6 with c/o acute onset of wheezing, shortness of breath, and chest tightness.  He began to have complaints of expiratory wheezing and productive cough with clear sputum since 5/5.  It was noted that he has been out of his inhalers .  Pt is currently intubated and sedated, with no family present, therefore history is obtained from ED notes.  In the ED pt received continuous Albuterol treatment, magnesium IV, Solu-Medrol IV, and Epinephrine, however continued to experience respiratory distress.  Trial of BiPap was not tolerated, and pt was subsequently intubated in the ED.  CXR is not concerning for any acute Pulmonary disease.   PCCM is consulted for admission for Acute Hypercapnic Respiratory Failure secondary to Asthma exacerbation requiring mechanical ventilation.  SUBJECTIVE:  Sedated and paralyzed, double clutching, no further events overnight  VITAL SIGNS: BP (!) 155/67   Pulse 87   Temp 98.9 F (37.2 C) (Oral)   Resp (!) 22   Ht 5' 7.01" (1.702 m)   Wt 158 lb 8.2 oz (71.9 kg)   SpO2 96%   BMI 24.82 kg/m   HEMODYNAMICS:    VENTILATOR SETTINGS: Vent Mode: PCV FiO2 (%):  [40 %] 40 % Set Rate:  [22 bmp] 22 bmp PEEP:  [5 cmH20] 5 cmH20 Plateau Pressure:  [21 cmH20-22 cmH20] 22 cmH20  INTAKE / OUTPUT: I/O last 3 completed shifts: In: 8697.5 [I.V.:6817.5; NG/GT:855; IV Piggyback:1025] Out: 5235 [Urine:5135; Emesis/NG output:100]  PHYSICAL EXAMINATION: General: Acutely ill appearing male, sedated and paralyzed HEENT: [place]/AT, PERRL, no EOM and MMM Neuro: On nimbex, difficult to ascertain CV: RRR, Nl S1/S2 and -M/R/G. PULM: Coarse BS  diffusely GI: Soft, NT, ND and +BS Extremities: warm/dry, negative edema  Skin: no rashes or lesions  LABS:  BMET Recent Labs  Lab 09/09/17 1252 09/11/17 0605 09/12/17 0342  NA 141 147* 144  K 3.4* 3.0* 4.7  CL 108 102 108  CO2 21* 38* 29  BUN 12 20 28*  CREATININE 1.30* 2.19* 1.76*  GLUCOSE 101* 123* 123*   Electrolytes Recent Labs  Lab 09/09/17 1252 09/11/17 0605 09/12/17 0342  CALCIUM 9.4 7.5* 8.0*  MG  --  3.0* 2.9*  PHOS  --  1.7* 2.6   CBC Recent Labs  Lab 09/09/17 1252 09/12/17 0342  WBC 8.4 12.2*  HGB 17.2* 11.4*  HCT 48.7 35.9*  PLT 283 179   Coag's No results for input(s): APTT, INR in the last 168 hours.  Sepsis Markers No results for input(s): LATICACIDVEN, PROCALCITON, O2SATVEN in the last 168 hours.  ABG Recent Labs  Lab 09/10/17 1305 09/11/17 0946 09/12/17 0412  PHART 7.394 7.594* 7.442  PCO2ART 41.1 39.9 40.3  PO2ART 72.8* 62.0* 99.0    Liver Enzymes No results for input(s): AST, ALT, ALKPHOS, BILITOT, ALBUMIN in the last 168 hours.  Cardiac Enzymes No results for input(s): TROPONINI, PROBNP in the last 168 hours.  Glucose Recent Labs  Lab 09/11/17 1156 09/11/17 1545 09/11/17 1945 09/11/17 2333 09/12/17 0329 09/12/17 0800  GLUCAP 125* 131* 143* 132* 122* 127*    Imaging Dg Chest Port 1 View  Result Date: 09/12/2017 CLINICAL  DATA:  Acute respiratory failure with hypoxia, asthma exacerbation, intubated EXAM: PORTABLE CHEST 1 VIEW COMPARISON:  09/12/2017 FINDINGS: Endotracheal tube 3.1 cm above the carina. NG tube within the stomach. Normal heart size and vascularity. Minor basilar atelectasis. No focal pneumonia, collapse or consolidation. Negative for edema, effusion or significant pneumothorax. Right neck subcutaneous emphysema noted. IMPRESSION: Stable support apparatus. Lower lung volumes with basilar atelectasis Right neck subcutaneous emphysema. Electronically Signed   By: Judie Petit.  Shick M.D.   On: 09/12/2017 07:58      STUDIES:  None 09/10/2017 UDS  CULTURES: None  ANTIBIOTICS: None  SIGNIFICANT EVENTS: 5/6>> Presents to [facility] ED with Acute Hypercapnic Respiratory distress requiring Mechanical Ventilation and Admission to [facility] ICU  LINES/TUBES: 5/6>>ETT 5/6>> Foley catheter  DISCUSSION: This is a 32 y/o male with Acute Hypercapnic Respiratory failure secondary to Acute Asthma exacerbation requiring mechanical ventilation.  09/10/2017 transferred to [facility] for with long hospital.  Difficult air trapping despite neuromuscular blockade and heavy sedation multiple changes made to ventilator.  ASSESSMENT / PLAN:  PULMONARY A: Acute Hypercapnic Respiratory failure secondary to Asthma Exacerbation Hx: Asthma  P:   Full vent support Attempt off paralytics today since gas exchange has improved dramatically F/U on bronch cultures Continue solu-Medrol Adjust vent for ABG Daily CXR while intubated Maintain sedated after paralytics are off PCV has been much more effective  CARDIOVASCULAR A:  Acute HTN, likely in setting of acute agitation and intubation  P:  Telemetry monitoring Neo for BP support Address electrolytes  RENAL Lab Results  Component Value Date   CREATININE 1.76 (H) 09/12/2017   CREATININE 2.19 (H) 09/11/2017   CREATININE 1.30 (H) 09/09/2017   Recent Labs  Lab 09/09/17 1252 09/11/17 0605 09/12/17 0342  K 3.4* 3.0* 4.7   A:   Acute Kidney Injury Hypokalemia Severe respiratory acidosis  P:   Replace electrolytes as indicated KVO IVF D/C Bicarb drip Monitor urine output Follow BMP  GASTROINTESTINAL A:   No acute issues  P:   Keep NPO for now Pepcid for SUP TF per nutrition  HEMATOLOGIC Recent Labs    09/09/17 1252 09/12/17 0342  HGB 17.2* 11.4*   A:   No acute issues  P:  Transfuse per ICU protocol SQ heparin for DVT prophylaxis CBC in AM  INFECTIOUS A:   No acute issues  P:   Monitor fever curve and  WBC Currently not on antibiotics  ENDOCRINE A:   CBG (last 3)  Recent Labs    09/11/17 2333 09/12/17 0329 09/12/17 0800  GLUCAP 132* 122* 127*   No acute issues    P:   ISS CBGs  NEUROLOGIC A:   Acute Encephalopathy likely in setting of Hypercapnia and sedation  P:   RASS score is 0 to -2 after off paralytics Propofol and fentanyl drips D/C nimbex  FAMILY  - Updates: Mother updated bedside.  - Inter-disciplinary family meet or Palliative Care meeting due by:  09/16/17  The patient is critically ill with multiple organ systems failure and requires high complexity decision making for assessment and support, frequent evaluation and titration of therapies, application of advanced monitoring technologies and extensive interpretation of multiple databases.   Critical Care Time devoted to patient care services described in this note is  32  Minutes. This time reflects time of care of this signee Dr [name]. This critical care time does not reflect procedure time, or teaching time or supervisory time of PA/NP/Med student/Med Resident etc but could  involve care discussion time.  Alyson Reedy, M.D. [facility] Pulmonary/Critical Care Medicine. Pager: [phone]. After hours pager: [phone].  09/12/2017, 12:14 PM

## 2017-09-12 NOTE — Progress Notes (Signed)
Nimbex stopped by MD for trial period.

## 2017-09-13 ENCOUNTER — Inpatient Hospital Stay (HOSPITAL_COMMUNITY): Payer: Self-pay

## 2017-09-13 LAB — BASIC METABOLIC PANEL
ANION GAP: 9 (ref 5–15)
BUN: 39 mg/dL — ABNORMAL HIGH (ref 6–20)
CO2: 25 mmol/L (ref 22–32)
Calcium: 9.1 mg/dL (ref 8.9–10.3)
Chloride: 108 mmol/L (ref 101–111)
Creatinine, Ser: 1.38 mg/dL — ABNORMAL HIGH (ref 0.61–1.24)
GLUCOSE: 135 mg/dL — AB (ref 65–99)
POTASSIUM: 4.5 mmol/L (ref 3.5–5.1)
Sodium: 142 mmol/L (ref 135–145)

## 2017-09-13 LAB — GLUCOSE, CAPILLARY
GLUCOSE-CAPILLARY: 121 mg/dL — AB (ref 65–99)
GLUCOSE-CAPILLARY: 123 mg/dL — AB (ref 65–99)
GLUCOSE-CAPILLARY: 126 mg/dL — AB (ref 65–99)
GLUCOSE-CAPILLARY: 152 mg/dL — AB (ref 65–99)
Glucose-Capillary: 142 mg/dL — ABNORMAL HIGH (ref 65–99)
Glucose-Capillary: 134 mg/dL — ABNORMAL HIGH (ref 65–99)
Glucose-Capillary: 131 mg/dL — ABNORMAL HIGH (ref 65–99)

## 2017-09-13 LAB — CBC
HEMATOCRIT: 41.1 % (ref 39.0–52.0)
Hemoglobin: 13.1 g/dL (ref 13.0–17.0)
MCH: 32 pg (ref 26.0–34.0)
MCHC: 31.9 g/dL (ref 30.0–36.0)
MCV: 100.5 fL — AB (ref 78.0–100.0)
Platelets: 203 10*3/uL (ref 150–400)
RBC: 4.09 MIL/uL — ABNORMAL LOW (ref 4.22–5.81)
RDW: 13.3 % (ref 11.5–15.5)
WBC: 20.9 10*3/uL — AB (ref 4.0–10.5)

## 2017-09-13 LAB — POCT I-STAT 3, ART BLOOD GAS (G3+)
Acid-Base Excess: 1 mmol/L (ref 0.0–2.0)
Bicarbonate: 24.9 mmol/L (ref 20.0–28.0)
O2 Saturation: 99 %
PCO2 ART: 37.5 mmHg (ref 32.0–48.0)
PH ART: 7.429 (ref 7.350–7.450)
TCO2: 26 mmol/L (ref 22–32)
pO2, Arterial: 158 mmHg — ABNORMAL HIGH (ref 83.0–108.0)

## 2017-09-13 LAB — CK TOTAL AND CKMB (NOT AT ARMC)
CK, MB: 21 ng/mL — ABNORMAL HIGH (ref 0.5–5.0)
Relative Index: 0.3 (ref 0.0–2.5)
Total CK: 6633 U/L — ABNORMAL HIGH (ref 49–397)

## 2017-09-13 LAB — MAGNESIUM: Magnesium: 3.1 mg/dL — ABNORMAL HIGH (ref 1.7–2.4)

## 2017-09-13 LAB — PHOSPHORUS: PHOSPHORUS: 2.7 mg/dL (ref 2.5–4.6)

## 2017-09-13 MED ORDER — VANCOMYCIN HCL IN DEXTROSE 1-5 GM/200ML-% IV SOLN
1000.0000 mg | Freq: Two times a day (BID) | INTRAVENOUS | Status: DC
  Administered 2017-09-13 – 2017-09-14 (×3): 1000 mg via INTRAVENOUS
  Filled 2017-09-13 (×3): qty 200

## 2017-09-13 MED ORDER — FUROSEMIDE 10 MG/ML IJ SOLN
60.0000 mg | Freq: Three times a day (TID) | INTRAMUSCULAR | Status: DC
  Administered 2017-09-13 – 2017-09-14 (×3): 60 mg via INTRAVENOUS
  Filled 2017-09-13 (×4): qty 6

## 2017-09-13 MED ORDER — POTASSIUM CHLORIDE 20 MEQ/15ML (10%) PO SOLN
40.0000 meq | Freq: Two times a day (BID) | ORAL | Status: DC
  Administered 2017-09-13 (×2): 40 meq
  Filled 2017-09-13 (×3): qty 30

## 2017-09-13 MED ORDER — IPRATROPIUM BROMIDE 0.02 % IN SOLN
0.5000 mg | RESPIRATORY_TRACT | Status: DC
  Administered 2017-09-13 – 2017-09-16 (×21): 0.5 mg via RESPIRATORY_TRACT
  Filled 2017-09-13 (×21): qty 2.5

## 2017-09-13 MED ORDER — LEVALBUTEROL HCL 0.63 MG/3ML IN NEBU
0.6300 mg | INHALATION_SOLUTION | RESPIRATORY_TRACT | Status: DC
  Administered 2017-09-13 – 2017-09-16 (×21): 0.63 mg via RESPIRATORY_TRACT
  Filled 2017-09-13 (×44): qty 3

## 2017-09-13 NOTE — Progress Notes (Signed)
PC decreased to from 35 to 25 per MD order due to VT >1L & VE 22-23.  Pt tolerating well, VT's now 700

## 2017-09-13 NOTE — Progress Notes (Signed)
Patient began coughing over vent and moderate amount of tube feed content was coming out of mouth. Tube feed was immediatly stopped, and suctioned airway. Critical care NP was on unit and was called to the room to further assess patient. Xray was ordered stat to verify tube placement.

## 2017-09-13 NOTE — Progress Notes (Addendum)
PULMONARY / CRITICAL CARE MEDICINE   Name: Jeremy Mccormick MRN: [id number] DOB: [birth year]    ADMISSION DATE:  09/09/2017 CONSULTATION DATE:  09/09/2017  REFERRING MD:  Dr. Ellender Hose  CHIEF COMPLAINT:  Wheezing and Shortness of Breath  BRIEF Jeremy Mccormick is a 32 y/o male with a PMH of asthma.  He presents to [facility] ED on 5/6 with c/o acute onset of wheezing, shortness of breath, and chest tightness.  He began to have complaints of expiratory wheezing and productive cough with clear sputum since 5/5.  It was noted that he has been out of his inhalers .  Pt is currently intubated and sedated, with no family present, therefore history is obtained from ED notes.  In the ED pt received continuous Albuterol treatment, magnesium IV, Solu-Medrol IV, and Epinephrine, however continued to experience respiratory distress.  Trial of BiPap was not tolerated, and pt was subsequently intubated in the ED.  CXR is not concerning for any acute Pulmonary disease.   PCCM is consulted for admission for Acute Hypercapnic Respiratory Failure secondary to Asthma exacerbation requiring mechanical ventilation.     STUDIES:  None 09/10/2017 UDS  CULTURES: None  ANTIBIOTICS: None  SIGNIFICANT EVENTS: 5/6>> Presents to [name] ED with Acute Hypercapnic Respiratory distress requiring Mechanical Ventilation and Admission to [facility] ICU 5/6>>ETT 5/6>> Foley catheter 5/ 9 - sedated and paralyzed, double clutching, no further events overnight    SUBJECTIVE/OVERNIGHT/INTERVAL HX 5/10 - back on nimbex. Wheezy, Hypertensive On vent. On diprivan gtt. femt gtt  VITAL SIGNS: BP (!) 144/80   Pulse (!) 106   Temp 97.9 F (36.6 C) (Axillary)   Resp (!) 22   Ht 5' 7.01" (1.702 m)   Wt 72.6 kg (160 lb 0.9 oz)   SpO2 98%   BMI 25.06 kg/m   HEMODYNAMICS:    VENTILATOR SETTINGS: Vent Mode: PCV FiO2 (%):  [40 %] 40 % Set Rate:  [22 bmp] 22 bmp PEEP:  [5 cmH20] 5 cmH20 Plateau Pressure:  [22 MWN02-72 cmH20]  22 cmH20  INTAKE / OUTPUT: I/O last 3 completed shifts: In: 6985.2 [I.V.:4400.2; NG/GT:2035; IV Piggyback:550] Out: 5366 [Urine:4250]  PHYSICAL EXAMINATION:  General Appearance:    Looks criticall ill  Head:    Normocephalic, without obvious abnormality, atraumatic  Eyes:    PERRL - yes, conjunctiva/corneas - clear      Ears:    Normal external ear canals, both ears  Nose:   NG tube - no  Throat:  ETT TUBE - yees , OG tube - yes  Neck:   Supple,  No enlargement/tenderness/nodules     Lungs:     Wheezy Ventilator   Synchrony - yes on nimbex and wheezy  Chest wall:    No deformity  Heart:    S1 and S2 normal, no murmur, CVP - no.  Pressors - no  Abdomen:     Soft, no masses, no organomegaly  Genitalia:    Not done  Rectal:   not done  Extremities:   Extremities- intact     Skin:   Intact in exposed areas .     Neurologic:   Sedation - Fent diprivan nimbex -> RASS - -5    PULMONARY Recent Labs  Lab 09/10/17 1016 09/10/17 1120 09/10/17 1305 09/11/17 0946 09/12/17 0412 09/13/17 0416  PHART 7.143* 7.211* 7.394 7.594* 7.442 7.429  PCO2ART 85.7* 73.8* 41.1 39.9 40.3 37.5  PO2ART 216.0* 162.0* 72.8* 62.0* 99.0 158.0*  HCO3 29.4* 29.5* 24.6 38.7* 27.4 24.9  TCO2 32 32  --  40* 29 26  O2SAT 99.0 99.0 93.5 95.0 98.0 99.0    CBC Recent Labs  Lab 09/09/17 1252 09/12/17 0342 09/13/17 0606  HGB 17.2* 11.4* 13.1  HCT 48.7 35.9* 41.1  WBC 8.4 12.2* 20.9*  PLT 283 179 203    COAGULATION No results for input(s): INR in the last 168 hours.  CARDIAC  No results for input(s): TROPONINI in the last 168 hours. No results for input(s): PROBNP in the last 168 hours.   CHEMISTRY Recent Labs  Lab 09/09/17 1252 09/11/17 0605 09/12/17 0342 09/13/17 0606  NA 141 147* 144 142  K 3.4* 3.0* 4.7 4.5  CL 108 102 108 108  CO2 21* 38* 29 25  GLUCOSE 101* 123* 123* 135*  BUN 12 20 28* 39*  CREATININE 1.30* 2.19* 1.76* 1.38*  CALCIUM 9.4 7.5* 8.0* 9.1  MG  --  3.0* 2.9* 3.1*   PHOS  --  1.7* 2.6 2.7   Estimated Creatinine Clearance: 73.2 mL/min (A) (by C-G formula based on SCr of 1.38 mg/dL (H)).   LIVER No results for input(s): AST, ALT, ALKPHOS, BILITOT, PROT, ALBUMIN, INR in the last 168 hours.   INFECTIOUS No results for input(s): LATICACIDVEN, PROCALCITON in the last 168 hours.   ENDOCRINE CBG (last 3)  Recent Labs    09/13/17 0006 09/13/17 0429 09/13/17 0739  GLUCAP 152* 121* 126*       Recent Results (from the past 240 hour(s))  MRSA PCR Screening     Status: None   Collection Time: 09/09/17  8:31 PM  Result Value Ref Range Status   MRSA by PCR NEGATIVE NEGATIVE Final    Comment:        The GeneXpert MRSA Assay (FDA approved for NASAL specimens only), is one component of a comprehensive MRSA colonization surveillance program. It is not intended to diagnose MRSA infection nor to guide or monitor treatment for MRSA infections. Performed at [facility], [place] [address], [place], [place] [address]   Culture, bal-quantitative     Status: Abnormal (Preliminary result)   Collection Time: 09/11/17 10:56 AM  Result Value Ref Range Status   Specimen Description BRONCHIAL ALVEOLAR LAVAGE  Final   Special Requests NONE  Final   Gram Stain   Final    ABUNDANT WBC PRESENT, PREDOMINANTLY PMN FEW GRAM POSITIVE COCCI IN CLUSTERS Performed at [facility], [address], [place], [place] [address]    Culture >=100,000 COLONIES/mL STAPHYLOCOCCUS AUREUS (A)  Final   Report Status PENDING  Incomplete  Urine Culture     Status: None   Collection Time: 09/11/17 11:14 AM  Result Value Ref Range Status   Specimen Description URINE, CATHETERIZED  Final   Special Requests NONE  Final   Culture   Final    NO GROWTH Performed at [facility], [place] [address] [place], [place] [address]    Report Status 09/12/2017 FINAL  Final  Culture, blood (Routine X 2) w Reflex to ID Panel     Status: None (Preliminary result)    Collection Time: 09/11/17 11:45 AM  Result Value Ref Range Status   Specimen Description BLOOD RIGHT HAND  Final   Special Requests   Final    BOTTLES DRAWN AEROBIC ONLY Blood Culture adequate volume   Culture   Final    NO GROWTH 2 DAYS Performed at [facility], [place] [address], [place], [place] [address]  Report Status PENDING  Incomplete  Culture, blood (Routine X 2) w Reflex to ID Panel     Status: None (Preliminary result)   Collection Time: 09/11/17 11:48 AM  Result Value Ref Range Status   Specimen Description BLOOD RIGHT HAND  Final   Special Requests   Final    BOTTLES DRAWN AEROBIC ONLY Blood Culture adequate volume   Culture   Final    NO GROWTH 2 DAYS Performed at [facility], [address], [place], [place] [address]    Report Status PENDING  Incomplete     IMAGING [phone]  - image(s) personally visualized  -   highlighted in bold Dg Chest Port 1 View  Result Date: 09/12/2017 CLINICAL DATA:  Acute respiratory failure with hypoxia, asthma exacerbation, intubated EXAM: PORTABLE CHEST 1 VIEW COMPARISON:  09/12/2017 FINDINGS: Endotracheal tube 3.1 cm above the carina. NG tube within the stomach. Normal heart size and vascularity. Minor basilar atelectasis. No focal pneumonia, collapse or consolidation. Negative for edema, effusion or significant pneumothorax. Right neck subcutaneous emphysema noted. IMPRESSION: Stable support apparatus. Lower lung volumes with basilar atelectasis Right neck subcutaneous emphysema. Electronically Signed   By: Jerilynn Mages.  Shick M.D.   On: 09/12/2017 07:58      DISCUSSION: This is a 32 y/o male with Acute Hypercapnic Respiratory failure secondary to Acute Asthma exacerbation requiring mechanical ventilation.  09/10/2017 transferred to [facility] for with long hospital.  Difficult air trapping despite neuromuscular blockade and heavy sedation multiple changes made to ventilator.  ASSESSMENT / PLAN:  PULMONARY A: Acute  Hypercapnic Respiratory failure secondary to Asthma Exacerbation Hx: Asthma  09/13/2017 - still wheezy and needing nibmex . Unable to come off nimbex yesterday  P:   Full vent support (PCV effective) Continue nimbex ; will try hard to get him off due to risk for myopathy with steroids sterpids BD - change to xopenex and atrovent q4h   CARDIOVASCULAR A:  Acute HTN, likely in setting of acute agitation and intubation  - wheezy with  hypertension   P:  Stat diruresises   RENAL Lab Results  Component Value Date   CREATININE 1.38 (H) 09/13/2017   CREATININE 1.76 (H) 09/12/2017   CREATININE 2.19 (H) 09/11/2017   Recent Labs  Lab 09/11/17 0605 09/12/17 0342 09/13/17 0606  K 3.0* 4.7 4.5   A:   Acute Kidney Injury  - AKI better  P:   Replace electrolytes as indicated KVO IVF Monitor urine output Follow BMP  GASTROINTESTINAL A:   No acute issues  P:   Keep NPO for now Pepcid for SUP TF per nutrition  HEMATOLOGIC Recent Labs    09/12/17 0342 09/13/17 0606  HGB 11.4* 13.1   A:   No acute issues  P:  Transfuse per ICU protocol SQ heparin for DVT prophylaxis - PRBC for hgb </= 6.9gm%    - exceptions are   -  if ACS susepcted/confirmed then transfuse for hgb </= 8.0gm%,  or    -  active bleeding with hemodynamic instability, then transfuse regardless of hemoglobin value   At at all times try to transfuse 1 unit prbc as possible with exception of active hemorrhage   INFECTIOUS A:   Staph in BAL 09/11/17. Sepciation pending  P:   Dc cefpeimine AAwiat staph speciation Anti-infectives (From admission, onward)   Start     Dose/Rate Route Frequency Ordered Stop   09/13/17 0900  vancomycin (VANCOCIN) IVPB 1000 mg/200 mL premix  1,000 mg 200 mL/hr over 60 Minutes Intravenous Every 12 hours 09/13/17 0823     09/12/17 1200  vancomycin (VANCOCIN) 1,250 mg in sodium chloride 0.9 % 250 mL IVPB  Status:  Discontinued     1,250 mg 166.7 mL/hr over 90  Minutes Intravenous Every 24 hours 09/11/17 1050 09/13/17 0823   09/11/17 1800  piperacillin-tazobactam (ZOSYN) IVPB 3.375 g  Status:  Discontinued     3.375 g 12.5 mL/hr over 240 Minutes Intravenous Every 8 hours 09/11/17 1050 09/11/17 1055   09/11/17 1200  vancomycin (VANCOCIN) 1,500 mg in sodium chloride 0.9 % 500 mL IVPB     1,500 mg 250 mL/hr over 120 Minutes Intravenous  Once 09/11/17 1050 09/11/17 1435   09/11/17 1200  piperacillin-tazobactam (ZOSYN) IVPB 3.375 g  Status:  Discontinued     3.375 g 100 mL/hr over 30 Minutes Intravenous  Once 09/11/17 1050 09/11/17 1055   09/11/17 1200  ceFEPIme (MAXIPIME) 2 g in sodium chloride 0.9 % 100 mL IVPB     2 g 200 mL/hr over 30 Minutes Intravenous Every 12 hours 09/11/17 1055         ENDOCRINE A:   CBG (last 3)  Recent Labs    09/13/17 0006 09/13/17 0429 09/13/17 0739  GLUCAP 152* 121* 126*   No acute issues    P:   ISS CBGs  NEUROLOGIC A:   Acute Encephalopathy likely in setting of Hypercapnia and sedation  - 09/13/2017 - deeply sedated and paralyzed  P:   RASS score is -5 on Propofol and fentanyl drips and nimbex  FAMILY  - Updates: Mother updated bedside 5/9. None on bedside 09/13/2017   - Inter-disciplinary family meet or Palliative Care meeting due by:  09/16/17       The patient is critically ill with multiple organ systems failure and requires high complexity decision making for assessment and support, frequent evaluation and titration of therapies, application of advanced monitoring technologies and extensive interpretation of multiple databases.   Critical Care Time devoted to patient care services described in this note is  30  Minutes. This time reflects time of care of this signee Dr Brand Males. This critical care time does not reflect procedure time, or teaching time or supervisory time of PA/NP/Med student/Med Resident etc but could involve care discussion time    Dr. Brand Males, M.D.,  [facility].C.P Pulmonary and Critical Care Medicine Staff Physician [place] [facility] Pager: [phone], If no answer or between  15:00h - 7:00h: call [phone]  09/13/2017 9:54 AM

## 2017-09-13 NOTE — Progress Notes (Signed)
Pharmacy Antibiotic Note  Jeremy Mccormick is a 32 y.o. male admitted on 09/09/2017 with asthma exacerbation. Remains intubated and paralyzed. Bronchoscopy today showed purulent secretions in bibasilar airways. Started on empiric antimicrobials. Scr has improved drastically to 1.38. PT is afebrile but WBC is up to 20.9. Pt is also on steroids.   Plan: Change vancomycin to 1gm IV Q12H Continue cefepime 2gm IV Q12H F/u renal fxn, C&S, clinical status and trough at SS   Height: 5' 7.01" (170.2 cm) Weight: 160 lb 0.9 oz (72.6 kg) IBW/kg (Calculated) : 66.12  Temp (24hrs), Avg:98.7 F (37.1 C), Min:97.8 F (36.6 C), Max:99.6 F (37.6 C)  Recent Labs  Lab 09/09/17 1252 09/11/17 0605 09/12/17 0342 09/13/17 0606  WBC 8.4  --  12.2* 20.9*  CREATININE 1.30* 2.19* 1.76* 1.38*     Antimicrobials this admission: 5/8 vancomycin > 5/8 cefepime >  Microbiology results: 5/6 mrsa: neg 5/8 blood cx: NGTD 5/8 TA: 5/8 BAL - NGTD 5/8 Urine - nEG  Jeremy Mccormick, Jeremy Mccormick 09/13/2017 8:23 AM

## 2017-09-14 ENCOUNTER — Inpatient Hospital Stay (HOSPITAL_COMMUNITY): Payer: Self-pay

## 2017-09-14 LAB — MAGNESIUM: MAGNESIUM: 2.5 mg/dL — AB (ref 1.7–2.4)

## 2017-09-14 LAB — BASIC METABOLIC PANEL
ANION GAP: 9 (ref 5–15)
Anion gap: 9 (ref 5–15)
BUN: 51 mg/dL — AB (ref 6–20)
BUN: 50 mg/dL — AB (ref 6–20)
CHLORIDE: 105 mmol/L (ref 101–111)
CO2: 29 mmol/L (ref 22–32)
CO2: 29 mmol/L (ref 22–32)
CREATININE: 1.78 mg/dL — AB (ref 0.61–1.24)
CREATININE: 1.77 mg/dL — AB (ref 0.61–1.24)
Calcium: 8.6 mg/dL — ABNORMAL LOW (ref 8.9–10.3)
Calcium: 9 mg/dL (ref 8.9–10.3)
Chloride: 106 mmol/L (ref 101–111)
GFR calc non Af Amer: 50 mL/min — ABNORMAL LOW (ref >60)
GFR, EST AFRICAN AMERICAN: 57 mL/min — AB (ref >60)
GFR, EST AFRICAN AMERICAN: 58 mL/min — AB (ref >60)
GFR, EST NON AFRICAN AMERICAN: 50 mL/min — AB (ref >60)
Glucose, Bld: 127 mg/dL — ABNORMAL HIGH (ref 65–99)
Glucose, Bld: 134 mg/dL — ABNORMAL HIGH (ref 65–99)
Potassium: 4.2 mmol/L (ref 3.5–5.1)
Potassium: 5 mmol/L (ref 3.5–5.1)
SODIUM: 144 mmol/L (ref 135–145)
Sodium: 143 mmol/L (ref 135–145)

## 2017-09-14 LAB — CBC WITH DIFFERENTIAL/PLATELET
BASOS PCT: 0 %
Basophils Absolute: 0 10*3/uL (ref 0.0–0.1)
EOS ABS: 0 10*3/uL (ref 0.0–0.7)
Eosinophils Relative: 0 %
HCT: 37 % — ABNORMAL LOW (ref 39.0–52.0)
Hemoglobin: 11.8 g/dL — ABNORMAL LOW (ref 13.0–17.0)
LYMPHS ABS: 3 10*3/uL (ref 0.7–4.0)
Lymphocytes Relative: 13 %
MCH: 32.1 pg (ref 26.0–34.0)
MCHC: 31.9 g/dL (ref 30.0–36.0)
MCV: 100.5 fL — ABNORMAL HIGH (ref 78.0–100.0)
MONO ABS: 2.8 10*3/uL — AB (ref 0.1–1.0)
Monocytes Relative: 12 %
NEUTROS ABS: 17.5 10*3/uL — AB (ref 1.7–7.7)
Neutrophils Relative %: 75 %
PLATELETS: 202 10*3/uL (ref 150–400)
RBC: 3.68 MIL/uL — ABNORMAL LOW (ref 4.22–5.81)
RDW: 13.2 % (ref 11.5–15.5)
WBC: 23.3 10*3/uL — ABNORMAL HIGH (ref 4.0–10.5)

## 2017-09-14 LAB — POCT I-STAT 3, ART BLOOD GAS (G3+)
Acid-Base Excess: 5 mmol/L — ABNORMAL HIGH (ref 0.0–2.0)
BICARBONATE: 30.6 mmol/L — AB (ref 20.0–28.0)
O2 Saturation: 99 %
PCO2 ART: 50.4 mmHg — AB (ref 32.0–48.0)
Patient temperature: 98.6
TCO2: 32 mmol/L (ref 22–32)
pH, Arterial: 7.392 (ref 7.350–7.450)
pO2, Arterial: 145 mmHg — ABNORMAL HIGH (ref 83.0–108.0)

## 2017-09-14 LAB — GLUCOSE, CAPILLARY
Glucose-Capillary: 117 mg/dL — ABNORMAL HIGH (ref 65–99)
Glucose-Capillary: 133 mg/dL — ABNORMAL HIGH (ref 65–99)
Glucose-Capillary: 157 mg/dL — ABNORMAL HIGH (ref 65–99)

## 2017-09-14 LAB — HEPATIC FUNCTION PANEL
ALT: 554 U/L — AB (ref 17–63)
AST: 117 U/L — AB (ref 15–41)
Albumin: 2.3 g/dL — ABNORMAL LOW (ref 3.5–5.0)
Alkaline Phosphatase: 45 U/L (ref 38–126)
BILIRUBIN DIRECT: 0.2 mg/dL (ref 0.1–0.5)
BILIRUBIN TOTAL: 1.3 mg/dL — AB (ref 0.3–1.2)
Indirect Bilirubin: 1.1 mg/dL — ABNORMAL HIGH (ref 0.3–0.9)
Total Protein: 6.1 g/dL — ABNORMAL LOW (ref 6.5–8.1)

## 2017-09-14 LAB — AMYLASE: Amylase: 40 U/L (ref 28–100)

## 2017-09-14 LAB — LIPASE, BLOOD: LIPASE: 22 U/L (ref 11–51)

## 2017-09-14 LAB — PHOSPHORUS: Phosphorus: 3.2 mg/dL (ref 2.5–4.6)

## 2017-09-14 LAB — LACTIC ACID, PLASMA: LACTIC ACID, VENOUS: 2.1 mmol/L — AB (ref 0.5–1.9)

## 2017-09-14 LAB — PROCALCITONIN: PROCALCITONIN: 0.33 ng/mL

## 2017-09-14 MED ORDER — FENTANYL 2500MCG IN NS 250ML (10MCG/ML) PREMIX INFUSION
25.0000 ug/h | INTRAVENOUS | Status: DC
  Administered 2017-09-14 (×4): 400 ug/h via INTRAVENOUS
  Administered 2017-09-15: 225 ug/h via INTRAVENOUS
  Administered 2017-09-15: 200 ug/h via INTRAVENOUS
  Administered 2017-09-15: 325 ug/h via INTRAVENOUS
  Administered 2017-09-16: 375 ug/h via INTRAVENOUS
  Filled 2017-09-14 (×8): qty 250

## 2017-09-14 MED ORDER — VECURONIUM BROMIDE 10 MG IV SOLR
INTRAVENOUS | Status: AC
  Administered 2017-09-14: 10 mg
  Filled 2017-09-14: qty 10

## 2017-09-14 MED ORDER — SODIUM CHLORIDE 0.9 % IV SOLN
0.5000 mg/h | INTRAVENOUS | Status: DC
  Administered 2017-09-14 (×3): 8 mg/h via INTRAVENOUS
  Administered 2017-09-15: 5.5 mg/h via INTRAVENOUS
  Administered 2017-09-15: 8 mg/h via INTRAVENOUS
  Administered 2017-09-16: 4 mg/h via INTRAVENOUS
  Administered 2017-09-16: 7 mg/h via INTRAVENOUS
  Filled 2017-09-14 (×8): qty 10

## 2017-09-14 MED ORDER — LACTATED RINGERS IV SOLN
INTRAVENOUS | Status: DC
Start: 2017-09-14 — End: 2017-09-15
  Administered 2017-09-14 (×2): via INTRAVENOUS

## 2017-09-14 MED ORDER — MIDAZOLAM HCL 2 MG/2ML IJ SOLN
1.0000 mg | INTRAMUSCULAR | Status: DC | PRN
  Administered 2017-09-14: 4 mg via INTRAVENOUS
  Administered 2017-09-15: 2 mg via INTRAVENOUS
  Administered 2017-09-16 (×2): 4 mg via INTRAVENOUS
  Administered 2017-09-16: 2 mg via INTRAVENOUS
  Filled 2017-09-14: qty 2
  Filled 2017-09-14: qty 4

## 2017-09-14 MED ORDER — MIDAZOLAM HCL 5 MG/ML IJ SOLN: 1.0000 mg | INTRAMUSCULAR | Status: DC | PRN

## 2017-09-14 MED ORDER — DEXMEDETOMIDINE HCL IN NACL 200 MCG/50ML IV SOLN
0.4000 ug/kg/h | INTRAVENOUS | Status: DC
  Administered 2017-09-14: 0.4 ug/kg/h via INTRAVENOUS
  Filled 2017-09-14: qty 50

## 2017-09-14 MED ORDER — DEXMEDETOMIDINE HCL IN NACL 400 MCG/100ML IV SOLN
0.4000 ug/kg/h | INTRAVENOUS | Status: DC
  Administered 2017-09-14: 0.4 ug/kg/h via INTRAVENOUS
  Administered 2017-09-14 (×2): 1 ug/kg/h via INTRAVENOUS
  Administered 2017-09-15: 0.8 ug/kg/h via INTRAVENOUS
  Administered 2017-09-15: 0.6 ug/kg/h via INTRAVENOUS
  Administered 2017-09-15 – 2017-09-16 (×2): 0.8 ug/kg/h via INTRAVENOUS
  Administered 2017-09-16: 0.9 ug/kg/h via INTRAVENOUS
  Administered 2017-09-16: 0.6 ug/kg/h via INTRAVENOUS
  Filled 2017-09-14 (×8): qty 100

## 2017-09-14 MED ORDER — METHYLPREDNISOLONE SODIUM SUCC 125 MG IJ SOLR
60.0000 mg | Freq: Four times a day (QID) | INTRAMUSCULAR | Status: DC
  Administered 2017-09-14 – 2017-09-15 (×4): 60 mg via INTRAVENOUS
  Filled 2017-09-14 (×4): qty 0.96

## 2017-09-14 MED ORDER — LACTATED RINGERS IV BOLUS
2000.0000 mL | Freq: Once | INTRAVENOUS | Status: AC
  Administered 2017-09-14: 2000 mL via INTRAVENOUS

## 2017-09-14 MED ORDER — ATROPINE SULFATE 1 MG/10ML IJ SOSY
PREFILLED_SYRINGE | INTRAMUSCULAR | Status: AC
  Administered 2017-09-15: 1 mg
  Filled 2017-09-14: qty 10

## 2017-09-14 MED ORDER — INSULIN ASPART 100 UNIT/ML ~~LOC~~ SOLN
2.0000 [IU] | SUBCUTANEOUS | Status: DC
  Administered 2017-09-14: 2 [IU] via SUBCUTANEOUS
  Administered 2017-09-14: 4 [IU] via SUBCUTANEOUS
  Administered 2017-09-15 (×3): 2 [IU] via SUBCUTANEOUS
  Administered 2017-09-15: 4 [IU] via SUBCUTANEOUS
  Administered 2017-09-16: 2 [IU] via SUBCUTANEOUS

## 2017-09-14 MED ORDER — PANTOPRAZOLE SODIUM 40 MG PO PACK
40.0000 mg | PACK | Freq: Every day | ORAL | Status: DC
  Administered 2017-09-14 – 2017-09-16 (×3): 40 mg
  Filled 2017-09-14 (×3): qty 20

## 2017-09-14 MED ORDER — LINEZOLID 600 MG/300ML IV SOLN
600.0000 mg | Freq: Two times a day (BID) | INTRAVENOUS | Status: DC
  Administered 2017-09-14 – 2017-09-15 (×2): 600 mg via INTRAVENOUS
  Filled 2017-09-14 (×2): qty 300

## 2017-09-14 NOTE — Progress Notes (Signed)
Paged CCM x3 times, received call back from Dr. Tawni Levy and discussed ventilator rate change to RR 16.

## 2017-09-14 NOTE — Progress Notes (Signed)
Looks stable on walk round  Dr. Kalman Shan, M.D., [facility].C.P Pulmonary and Critical Care Medicine Staff Physician, [facility] Center Director - Interstitial Lung Disease  Program  Pulmonary Fibrosis [facility] [place], [place], [address]  Pager: [phone], If no answer or between  15:00h - 7:00h: call [phone] Telephone: [phone]

## 2017-09-14 NOTE — Progress Notes (Signed)
RT called to bedside Pt dyssynchronous with Vent, audible leak  Noted, ETT found at 22@ lip had been at 25 all day, tube adjusted, [facility] equal chest rise equal. MD Ramaswamy paged for RT order to change Rate on Vent. Pt paradoxical, and air trapping visually on loops.

## 2017-09-14 NOTE — Progress Notes (Signed)
CRITICAL VALUE ALERT  Critical Value: Lactic Acid   Date & Time Notied: 09/14/17 01:04   Provider Notified: Pola Corn MD  Orders Received/Actions taken: MD notified. No orders received.

## 2017-09-14 NOTE — Progress Notes (Signed)
PULMONARY / CRITICAL CARE MEDICINE   Name: Jeremy Mccormick MRN: [id number] DOB: 1986-01-10    ADMISSION DATE:  09/09/2017 CONSULTATION DATE:  09/09/2017  REFERRING MD:  Dr. Ellender Hose  CHIEF COMPLAINT:  Wheezing and Shortness of Breath  BRIEF Mr. Spratlin is a 32 y/o male with a PMH of asthma.  He presents to [facility] ED on 5/6 with c/o acute onset of wheezing, shortness of breath, and chest tightness.  He began to have complaints of expiratory wheezing and productive cough with clear sputum since 5/5.  It was noted that he has been out of his inhalers .  Pt is currently intubated and sedated, with no family present, therefore history is obtained from ED notes.  In the ED pt received continuous Albuterol treatment, magnesium IV, Solu-Medrol IV, and Epinephrine, however continued to experience respiratory distress.  Trial of BiPap was not tolerated, and pt was subsequently intubated in the ED.  CXR is not concerning for any acute Pulmonary disease.   PCCM is consulted for admission for Acute Hypercapnic Respiratory Failure secondary to Asthma exacerbation requiring mechanical ventilation.   SIGNIFICANT EVENTS: 5/6>> Presents to [name] ED with Acute Hypercapnic Respiratory distress requiring Mechanical Ventilation and Admission to [facility] ICU 5/6>>ETT 5/6>> Foley catheter 57/19 - UDS positivie cocaine, THC  And benzo 5/8 - BAL lung with staph > 100K 5/ 9 - sedated and paralyzed, double clutching, no further events overnight 5/10 - back on nimbex. Wheezy, Hypertensive On vent. On diprivan gtt. femt gtt. Abx narrowed to vanc    SUBJECTIVE/OVERNIGHT/INTERVAL HX 5/11/1 9 - Lactate 2.1 and CK 6,663 - and very high. LFT up, Mild AKI. Unable tto get off nimbex due to vent dysnchrony. Being diuresed since  yesterday without improvement in vent dysnchrony but wheeze has resolved. Did vomit yesterday and this AM low grade fever with rising wbc. TF on hold. BIS 40s  VITAL SIGNS: BP 130/69 (BP  Location: Left Arm)   Pulse 88   Temp (S) 99.7 F (37.6 C) (Axillary)   Resp (!) 22   Ht 5' 7.01" (1.702 m)   Wt 73.2 kg (161 lb 6 oz)   SpO2 92%   BMI 25.27 kg/m   HEMODYNAMICS:    VENTILATOR SETTINGS: Vent Mode: PCV FiO2 (%):  [40 %] 40 % Set Rate:  [22 bmp-33 bmp] 22 bmp PEEP:  [5 cmH20] 5 cmH20 Plateau Pressure:  [18 cmH20-26 cmH20] 18 cmH20  INTAKE / OUTPUT: I/O last 3 completed shifts: In: 4102.8 [I.V.:2412.8; NG/GT:1490; IV Piggyback:200] Out: 8016 [PVVZS:8270]  PHYSICAL EXAMINATION:  General Appearance:    Looks criticall ill. Muscular male.   Head:    Normocephalic, without obvious abnormality, atraumatic  Eyes:    PERRL - yes, conjunctiva/corneas - clear      Ears:    Normal external ear canals, both ears  Nose:   NG tube - no  Throat:  ETT TUBE - yes , OG tube - yes  Neck:   Supple,  No enlargement/tenderness/nodules     Lungs:     Clear to auscultation bilaterally, Ventilator   Synchrony - yes. Wheeze resolved  Chest wall:    No deformity  Heart:    S1 and S2 normal, no murmur, CVP - no.  Pressors - no  Abdomen:     Soft, no masses, no organomegaly  Genitalia:    Not done  Rectal:   not done  Extremities:   Extremities- intact     Skin:   Intact in  exposed areas .     Neurologic:   Sedation -fent gtt, diprivan gtt -> RASS - -5/BIS 40s . Moves all 4s - no. CAM-ICU - not applicable . Orientation - not applicable       PULMONARY Recent Labs  Lab 09/10/17 1016 09/10/17 1120 09/10/17 1305 09/11/17 0946 09/12/17 0412 09/13/17 0416  PHART 7.143* 7.211* 7.394 7.594* 7.442 7.429  PCO2ART 85.7* 73.8* 41.1 39.9 40.3 37.5  PO2ART 216.0* 162.0* 72.8* 62.0* 99.0 158.0*  HCO3 29.4* 29.5* 24.6 38.7* 27.4 24.9  TCO2 32 32  --  40* 29 26  O2SAT 99.0 99.0 93.5 95.0 98.0 99.0    CBC Recent Labs  Lab 09/12/17 0342 09/13/17 0606 09/14/17 0307  HGB 11.4* 13.1 11.8*  HCT 35.9* 41.1 37.0*  WBC 12.2* 20.9* 23.3*  PLT 179 203 202    COAGULATION No  results for input(s): INR in the last 168 hours.  CARDIAC  No results for input(s): TROPONINI in the last 168 hours. No results for input(s): PROBNP in the last 168 hours.   CHEMISTRY Recent Labs  Lab 09/09/17 1252 09/11/17 5427 09/12/17 0342 09/13/17 0606 09/14/17 0000 09/14/17 0307  NA 141 147* 144 142 143  --   K 3.4* 3.0* 4.7 4.5 5.0  --   CL 108 102 108 108 105  --   CO2 21* 38* _0 --   GLUCOSE 101* 123* 123* 135* 134*  --   BUN 12 20 28* 39* 50*  --   CREATININE 1.30* 2.19* 1.76* 1.38* 1.77*  --   CALCIUM 9.4 7.5* 8.0* 9.1 9.0  --   MG  --  3.0* 2.9* 3.1*  --  2.5*  PHOS  --  1.7* 2.6 2.7  --  3.2   Estimated Creatinine Clearance: 57.1 mL/min (A) (by C-G formula based on SCr of 1.77 mg/dL (H)).   LIVER Recent Labs  Lab 09/14/17 0307  AST 117*  ALT 554*  ALKPHOS 45  BILITOT 1.3*  PROT 6.1*  ALBUMIN 2.3*     INFECTIOUS Recent Labs  Lab 09/14/17 0000  LATICACIDVEN 2.1*     ENDOCRINE CBG (last 3)  Recent Labs    09/13/17 1947 09/13/17 2313 09/14/17 0332  GLUCAP 134* 131* 117*       Recent Results (from the past 240 hour(s))  MRSA PCR Screening     Status: None   Collection Time: 09/09/17  8:31 PM  Result Value Ref Range Status   MRSA by PCR NEGATIVE NEGATIVE Final    Comment:        The GeneXpert MRSA Assay (FDA approved for NASAL specimens only), is one component of a comprehensive MRSA colonization surveillance program. It is not intended to diagnose MRSA infection nor to guide or monitor treatment for MRSA infections. Performed at [facility], [place] [address] [place], [place] [address]   Culture, bal-quantitative     Status: Abnormal (Preliminary result)   Collection Time: 09/11/17 10:56 AM  Result Value Ref Range Status   Specimen Description BRONCHIAL ALVEOLAR LAVAGE  Final   Special Requests NONE  Final   Gram Stain   Final    ABUNDANT WBC PRESENT, PREDOMINANTLY PMN FEW GRAM POSITIVE COCCI IN  CLUSTERS Performed at [facility], [address], [place], [place] [address]    Culture >=100,000 COLONIES/mL STAPHYLOCOCCUS AUREUS (A)  Final   Report Status PENDING  Incomplete  Urine Culture     Status: None   Collection Time:  09/11/17 11:14 AM  Result Value Ref Range Status   Specimen Description URINE, CATHETERIZED  Final   Special Requests NONE  Final   Culture   Final    NO GROWTH Performed at [facility], [address], [place], [place] [address]    Report Status 09/12/2017 FINAL  Final  Culture, blood (Routine X 2) w Reflex to ID Panel     Status: None (Preliminary result)   Collection Time: 09/11/17 11:45 AM  Result Value Ref Range Status   Specimen Description BLOOD RIGHT HAND  Final   Special Requests   Final    BOTTLES DRAWN AEROBIC ONLY Blood Culture adequate volume   Culture   Final    NO GROWTH 2 DAYS Performed at [facility], [place] [address] [place], [place] [address]    Report Status PENDING  Incomplete  Culture, blood (Routine X 2) w Reflex to ID Panel     Status: None (Preliminary result)   Collection Time: 09/11/17 11:48 AM  Result Value Ref Range Status   Specimen Description BLOOD RIGHT HAND  Final   Special Requests   Final    BOTTLES DRAWN AEROBIC ONLY Blood Culture adequate volume   Culture   Final    NO GROWTH 2 DAYS Performed at [facility], [place] [address], [place], [place] [address]    Report Status PENDING  Incomplete     IMAGING [phone]  - image(s) personally visualized  -   highlighted in bold Dg Chest Port 1 View  Result Date: 09/14/2017 CLINICAL DATA:  32 year old male status post intubation. EXAM: PORTABLE CHEST 1 VIEW COMPARISON:  Chest radiograph dated 09/13/2017 FINDINGS: Endotracheal tube above the carina in similar position as before and enteric tube extending to the left hemiabdomen with tip beyond the inferior margin of the image. Minimal bibasilar atelectatic changes. No focal consolidation. There is no  pleural effusion or pneumothorax. The cardiac silhouette is within normal limits. No acute osseous pathology. IMPRESSION: No interval change. Electronically Signed   By: Anner Crete M.D.   On: 09/14/2017 00:25   Dg Chest Port 1 View  Result Date: 09/13/2017 CLINICAL DATA:  Endotracheal tube EXAM: PORTABLE CHEST 1 VIEW COMPARISON:  Yesterday FINDINGS: Endotracheal tube tip between the clavicular heads and carina. An orogastric tube reaches the stomach. Artifact from EKG leads. Mild haziness of the lower chest. No air bronchogram, edema, effusion, or pneumothorax. There is soft tissue emphysema at the right supraclavicular fossa that was also seen yesterday. Normal heart size. IMPRESSION: 1. Stable positioning of support tubes. 2. Mild haziness of the lower chest, likely mild atelectasis. 3. Stable right supraclavicular soft tissue emphysema. Electronically Signed   By: Monte Fantasia M.D.   On: 09/13/2017 11:29      DISCUSSION: This is a 32 y/o male with Acute Hypercapnic Respiratory failure secondary to Acute Asthma exacerbation requiring mechanical ventilation.  09/10/2017 transferred to [facility] for with long hospital.  Difficult air trapping despite neuromuscular blockade and heavy sedation multiple changes made to ventilator.  ASSESSMENT / PLAN:  PULMONARY A: Acute Hypercapnic Respiratory failure secondary to Asthma Exacerbation Hx: Asthma  09/14/2017 -  Vent dependent. Unable to come off Nimbex. But wheeze resolved with diuresis/steroids  P:   Full PCV vent support to conitnue Continue nimbex ; will try hard to get him off due to risk for myopathy with steroids but coming off proving difficult sterpids BD -  xopenex and atrovent q4h  CARDIOVASCULAR A:  Acute HTN, likely in setting of acute agitation and intubation  5/10  wheezy with  hypertension 5/11 - improved with lasix  P:  Stop lasix (rhabdo with worsening AKI)   RENAL Lab Results  Component Value  Date   CREATININE 1.77 (H) 09/14/2017   CREATININE 1.38 (H) 09/13/2017   CREATININE 1.76 (H) 09/12/2017   Recent Labs  Lab 09/12/17 0342 09/13/17 0606 09/14/17 0000  K 4.7 4.5 5.0   A:   Acute Kidney Injury  - AKI worse after lasix. Also evidence of rhabdo 6K on 09/14/17 (likely due to cocaine at admit but diprivan might be contributing)  P:   Stop lasix Start fluid bolus and maintenance fluids Stop diprivan Aim to stop vanc asap (as soon as staph speciation back)  GASTROINTESTINAL A:   Vomit late night 09/13/17. TF on hold/. On 09/14/17 - transaminitis +  P:   Continue NPO in terms of nutrition Chang pepcide to protonix Check KUB, amylase, lipase, later on 09/14/17 REcheck LFT 09/15/17   HEMATOLOGIC Recent Labs    09/13/17 0606 09/14/17 0307  HGB 13.1 11.8*   A:   No acute issues  P:  Transfuse per ICU protocol SQ heparin for DVT prophylaxis - PRBC for hgb </= 6.9gm%    - exceptions are   -  if ACS susepcted/confirmed then transfuse for hgb </= 8.0gm%,  or    -  active bleeding with hemodynamic instability, then transfuse regardless of hemoglobin value   At at all times try to transfuse 1 unit prbc as possible with exception of active hemorrhage   INFECTIOUS A:   STaph in BAL 09/11/17. Speciation pending Also, vomit 5/10 and 5/11 wbc rising  P:   DC vanc due to AKI Start zyvox Check PCT, amylase, lipase Anti-infectives (From admission, onward)   Start     Dose/Rate Route Frequency Ordered Stop   09/14/17 2000  linezolid (ZYVOX) IVPB 600 mg     600 mg 300 mL/hr over 60 Minutes Intravenous Every 12 hours 09/14/17 0959     09/13/17 0900  vancomycin (VANCOCIN) IVPB 1000 mg/200 mL premix  Status:  Discontinued     1,000 mg 200 mL/hr over 60 Minutes Intravenous Every 12 hours 09/13/17 0823 09/14/17 0959   09/12/17 1200  vancomycin (VANCOCIN) 1,250 mg in sodium chloride 0.9 % 250 mL IVPB  Status:  Discontinued     1,250 mg 166.7 mL/hr over 90 Minutes  Intravenous Every 24 hours 09/11/17 1050 09/13/17 0823   09/11/17 1800  piperacillin-tazobactam (ZOSYN) IVPB 3.375 g  Status:  Discontinued     3.375 g 12.5 mL/hr over 240 Minutes Intravenous Every 8 hours 09/11/17 1050 09/11/17 1055   09/11/17 1200  vancomycin (VANCOCIN) 1,500 mg in sodium chloride 0.9 % 500 mL IVPB     1,500 mg 250 mL/hr over 120 Minutes Intravenous  Once 09/11/17 1050 09/11/17 1435   09/11/17 1200  piperacillin-tazobactam (ZOSYN) IVPB 3.375 g  Status:  Discontinued     3.375 g 100 mL/hr over 30 Minutes Intravenous  Once 09/11/17 1050 09/11/17 1055   09/11/17 1200  ceFEPIme (MAXIPIME) 2 g in sodium chloride 0.9 % 100 mL IVPB  Status:  Discontinued     2 g 200 mL/hr over 30 Minutes Intravenous Every 12 hours 09/11/17 1055 09/13/17 1014       ENDOCRINE A:   CBG (last 3)  Recent Labs    09/13/17 1947 09/13/17 2313 09/14/17 0332  GLUCAP 134* 131* 117*  No acute issues    P:   ISS CBGs  NEUROLOGIC A:   Acute Encephalopathy likely in setting of Hypercapnia and sedation  - 09/14/2017 - deeply sedated and paralyzed and unable to come off nimbex. Rhabdo +  P:   DC diprivan Start versed gtt Continue fent gtt Continue nimbex but maybe after above we can come off Goal RASS -5/ BIS < 60  FAMILY  - Updates: Mother updated bedside 5/9. None at bedside 09/13/17 and 09/14/17  - Inter-disciplinary family meet or Palliative Care meeting due by:  09/16/17       The patient is critically ill with multiple organ systems failure and requires high complexity decision making for assessment and support, frequent evaluation and titration of therapies, application of advanced monitoring technologies and extensive interpretation of multiple databases.   Critical Care Time devoted to patient care services described in this note is  30  Minutes. This time reflects time of care of this signee Dr Brand Males. This critical care time does not reflect procedure time, or  teaching time or supervisory time of PA/NP/Med student/Med Resident etc but could involve care discussion time    Dr. Brand Males, M.D., [facility].C.P Pulmonary and Critical Care Medicine Staff Physician [place] [facility] Pager: [phone], If no answer or between  15:00h - 7:00h: call [phone]  09/14/2017 10:01 AM

## 2017-09-14 NOTE — Progress Notes (Signed)
Arterial blood gas drawn on the following settings PC25,RR16,+5,60%

## 2017-09-14 NOTE — Progress Notes (Signed)
   Now off nimbex On fent and versed gtt Had vagal episode with cough and still dysnchronous off nimbex  Plan Start precedex gtt - monitor carefully for vagal/bradycardia     Dr. Kalman Shan, M.D., [facility].C.P Pulmonary and Critical Care Medicine Staff Physician, [facility] Director - Interstitial Lung Disease  Program  Pulmonary Fibrosis [facility] Network at [facility] [place], [place], [address]  Pager: [phone], If no answer or between  15:00h - 7:00h: call [phone] Telephone: [phone]

## 2017-09-15 ENCOUNTER — Inpatient Hospital Stay (HOSPITAL_COMMUNITY): Payer: Self-pay

## 2017-09-15 DIAGNOSIS — F1410 Cocaine abuse, uncomplicated: Secondary | ICD-10-CM

## 2017-09-15 LAB — GLUCOSE, CAPILLARY
GLUCOSE-CAPILLARY: 125 mg/dL — AB (ref 65–99)
Glucose-Capillary: 128 mg/dL — ABNORMAL HIGH (ref 65–99)
Glucose-Capillary: 215 mg/dL — ABNORMAL HIGH (ref 65–99)
Glucose-Capillary: 115 mg/dL — ABNORMAL HIGH (ref 65–99)
Glucose-Capillary: 144 mg/dL — ABNORMAL HIGH (ref 65–99)

## 2017-09-15 LAB — CULTURE, BAL-QUANTITATIVE W GRAM STAIN: Culture: >=100000 — AB

## 2017-09-15 LAB — CBC WITH DIFFERENTIAL/PLATELET
BASOS ABS: 0 10*3/uL (ref 0.0–0.1)
Basophils Relative: 0 %
EOS ABS: 0 10*3/uL (ref 0.0–0.7)
Eosinophils Relative: 0 %
HCT: 37.6 % — ABNORMAL LOW (ref 39.0–52.0)
HEMOGLOBIN: 11.8 g/dL — AB (ref 13.0–17.0)
LYMPHS PCT: 11 %
Lymphs Abs: 2.1 10*3/uL (ref 0.7–4.0)
MCH: 31.8 pg (ref 26.0–34.0)
MCHC: 31.4 g/dL (ref 30.0–36.0)
MCV: 101.3 fL — ABNORMAL HIGH (ref 78.0–100.0)
MONOS PCT: 14 %
Monocytes Absolute: 2.7 10*3/uL — ABNORMAL HIGH (ref 0.1–1.0)
NEUTROS PCT: 75 %
Neutro Abs: 14.6 10*3/uL — ABNORMAL HIGH (ref 1.7–7.7)
Platelets: 213 10*3/uL (ref 150–400)
RBC: 3.71 MIL/uL — ABNORMAL LOW (ref 4.22–5.81)
RDW: 13 % (ref 11.5–15.5)
WBC: 19.4 10*3/uL — ABNORMAL HIGH (ref 4.0–10.5)

## 2017-09-15 LAB — BASIC METABOLIC PANEL
ANION GAP: 10 (ref 5–15)
BUN: 44 mg/dL — ABNORMAL HIGH (ref 6–20)
CALCIUM: 8.7 mg/dL — AB (ref 8.9–10.3)
CO2: 28 mmol/L (ref 22–32)
Chloride: 104 mmol/L (ref 101–111)
Creatinine, Ser: 1.47 mg/dL — ABNORMAL HIGH (ref 0.61–1.24)
Glucose, Bld: 150 mg/dL — ABNORMAL HIGH (ref 65–99)
POTASSIUM: 4.5 mmol/L (ref 3.5–5.1)
Sodium: 142 mmol/L (ref 135–145)

## 2017-09-15 LAB — HEPATIC FUNCTION PANEL
ALT: 385 U/L — ABNORMAL HIGH (ref 17–63)
AST: 61 U/L — ABNORMAL HIGH (ref 15–41)
Albumin: 2.4 g/dL — ABNORMAL LOW (ref 3.5–5.0)
Alkaline Phosphatase: 47 U/L (ref 38–126)
BILIRUBIN TOTAL: 1 mg/dL (ref 0.3–1.2)
Bilirubin, Direct: 0.2 mg/dL (ref 0.1–0.5)
Indirect Bilirubin: 0.8 mg/dL (ref 0.3–0.9)
Total Protein: 6.2 g/dL — ABNORMAL LOW (ref 6.5–8.1)

## 2017-09-15 LAB — PHOSPHORUS: PHOSPHORUS: 4.3 mg/dL (ref 2.5–4.6)

## 2017-09-15 LAB — CK TOTAL AND CKMB (NOT AT ARMC)
CK TOTAL: 1438 U/L — AB (ref 49–397)
CK, MB: 10 ng/mL — AB (ref 0.5–5.0)
RELATIVE INDEX: 0.7 (ref 0.0–2.5)

## 2017-09-15 LAB — CULTURE, BAL-QUANTITATIVE

## 2017-09-15 LAB — LACTIC ACID, PLASMA: LACTIC ACID, VENOUS: 1.7 mmol/L (ref 0.5–1.9)

## 2017-09-15 LAB — MAGNESIUM: MAGNESIUM: 2.6 mg/dL — AB (ref 1.7–2.4)

## 2017-09-15 LAB — PROCALCITONIN: Procalcitonin: 0.23 ng/mL

## 2017-09-15 MED ORDER — VITAL HIGH PROTEIN PO LIQD
1000.0000 mL | ORAL | Status: DC
  Administered 2017-09-15 – 2017-09-16 (×2): 1000 mL

## 2017-09-15 MED ORDER — METHYLPREDNISOLONE SODIUM SUCC 125 MG IJ SOLR
60.0000 mg | Freq: Two times a day (BID) | INTRAMUSCULAR | Status: DC
  Administered 2017-09-15 – 2017-09-18 (×6): 60 mg via INTRAVENOUS
  Filled 2017-09-15 (×7): qty 0.96

## 2017-09-15 MED ORDER — CEFAZOLIN SODIUM-DEXTROSE 2-4 GM/100ML-% IV SOLN
2.0000 g | Freq: Three times a day (TID) | INTRAVENOUS | Status: DC
  Administered 2017-09-15 – 2017-09-19 (×12): 2 g via INTRAVENOUS
  Filled 2017-09-15 (×15): qty 100

## 2017-09-15 MED ORDER — ATROPINE SULFATE 1 MG/10ML IJ SOSY
PREFILLED_SYRINGE | INTRAMUSCULAR | Status: AC
  Filled 2017-09-15: qty 10

## 2017-09-15 MED ORDER — DEXTROSE IN LACTATED RINGERS 5 % IV SOLN
INTRAVENOUS | Status: DC
  Administered 2017-09-15 – 2017-09-17 (×3): via INTRAVENOUS

## 2017-09-15 NOTE — Progress Notes (Signed)
Pharmacy Antibiotic Note  Jeremy Mccormick is a 32 y.o. male admitted on 09/09/2017 with asthma exacerbation. Remains intubated and paralyzed. Bronchoscopy today showed purulent secretions in bibasilar airways. Started on empiric antimicrobials. Now narrowing to cefazolin based on MSSA growing in BAL. SCr has improved to 1.47. WBC is trending down and pt is afebrile.  Plan: Cefazolin 2gm IV Q8H F/u renal fxn, C&S, clinical status and LOT   Height: 5' 7.01" (170.2 cm) Weight: 162 lb 0.6 oz (73.5 kg) IBW/kg (Calculated) : 66.12  Temp (24hrs), Avg:97.8 F (36.6 C), Min:97.4 F (36.3 C), Max:99 F (37.2 C)  Recent Labs  Lab 09/09/17 1252  09/12/17 0342 09/13/17 0606 09/14/17 0000 09/14/17 0307 09/14/17 1106 09/14/17 2328 09/15/17 0332  WBC 8.4  --  12.2* 20.9*  --  23.3*  --   --  19.4*  CREATININE 1.30*   < > 1.76* 1.38* 1.77*  --  1.78* 1.47*  --   LATICACIDVEN  --   --   --   --  2.1*  --   --  1.7  --    < > = values in this interval not displayed.     Antimicrobials this admission: Cefazolin 5/12>> Linzolid 5/11>>5/12 5/8 vancomycin >5/11 5/8 cefepime >5/10  Microbiology results: 5/6 mrsa: neg 5/8 blood cx: NGTD 5/8 BAL - MSSA 5/8 Urine - NEG  Paola Aleshire, [name] 09/15/2017 10:01 AM

## 2017-09-15 NOTE — Progress Notes (Signed)
PULMONARY / CRITICAL CARE MEDICINE   Name: Jeremy Mccormick MRN: [id number] DOB: 1985/06/08    ADMISSION DATE:  09/09/2017 CONSULTATION DATE:  09/09/2017  REFERRING MD:  Dr. Ellender Hose  CHIEF COMPLAINT:  Wheezing and Shortness of Breath  BRIEF Jeremy Mccormick is a 32 y/o male with a PMH of asthma.  He presents to [facility] ED on 5/6 with c/o acute onset of wheezing, shortness of breath, and chest tightness.  He began to have complaints of expiratory wheezing and productive cough with clear sputum since 5/5.  It was noted that he has been out of his inhalers .  Pt is currently intubated and sedated, with no family present, therefore history is obtained from ED notes.  In the ED pt received continuous Albuterol treatment, magnesium IV, Solu-Medrol IV, and Epinephrine, however continued to experience respiratory distress.  Trial of BiPap was not tolerated, and pt was subsequently intubated in the ED.  CXR is not concerning for any acute Pulmonary disease.   PCCM is consulted for admission for Acute Hypercapnic Respiratory Failure secondary to Asthma exacerbation requiring mechanical ventilation.   SIGNIFICANT EVENTS: 5/6>> Presents to [name] with Acute Hypercapnic Respiratory distress requiring Mechanical Ventilation and Admission to [facility] ICU 5/6>>ETT 5/6>> Foley catheter 57/19 - UDS positivie cocaine, THC  And benzo 5/8 - BAL lung with staph > 100K. MSSA 5/ 9 - sedated and paralyzed, double clutching, no further events overnight 5/10 - back on nimbex. Wheezy, Hypertensive On vent. On diprivan gtt. femt gtt. Abx narrowed to vanc  5/11/1 9 - Lactate 2.1 and CK 6,663 - and very high. LFT up, Mild AKI. Unable tto get off nimbex due to vent dysnchrony. Being diuresed since  yesterday without improvement in vent dysnchrony but wheeze has resolved. Did vomit yesterday and this AM low grade fever with rising wbc. TF on hold. BIS 40s   SUBJECTIVE/OVERNIGHT/INTERVAL HX 09/15/17 - Off diprivan x 48h.  Off nimbex x 24h. CK improved to 1438 . ABx changed to zyvox yesterday due to AKI. Now speciated to MSSA in BAL. Now on precedex gtt,, versed and fent gtt. YEsterday AM and last night -> gag and vomit with bradycardia during wake up assessment. TF on hold.   VITAL SIGNS: BP (!) 142/77   Pulse 77   Temp (!) 97.5 F (36.4 C) (Oral)   Resp 16   Ht 5' 7.01" (1.702 m)   Wt 73.5 kg (162 lb 0.6 oz)   SpO2 96%   BMI 25.37 kg/m   HEMODYNAMICS:    VENTILATOR SETTINGS: Vent Mode: PCV FiO2 (%):  [40 %-100 %] 50 % Set Rate:  [16 bmp-22 bmp] 16 bmp PEEP:  [5 cmH20] 5 cmH20 Plateau Pressure:  [17 cmH20-20 cmH20] 20 cmH20  INTAKE / OUTPUT: I/O last 3 completed shifts: In: 7044.3 [I.V.:4649.3; NG/GT:395; IV Piggyback:2000] Out: 5916 [Urine:4955; Emesis/NG output:960]  PHYSICAL EXAMINATION:  General Appearance:    Looks criticall ill. Muscular male  Head:    Normocephalic, without obvious abnormality, atraumatic  Eyes:    PERRL - yes, conjunctiva/corneas - clear      Ears:    Normal external ear canals, both ears  Nose:   NG tube - no  Throat:  ETT TUBE - yes , OG tube - yes  Neck:   Supple,  No enlargement/tenderness/nodules     Lungs:     Clear to auscultation bilaterally, Ventilator   Synchrony - yes  Chest wall:    No deformity  Heart:  S1 and S2 normal, no murmur, CVP - no.  Pressors - no  Abdomen:     Soft, no masses, no organomegaly  Genitalia:    Not done  Rectal:   not done  Extremities:   Extremities- intact     Skin:   Intact in exposed areas .      Neurologic:   Sedation - fent gtt, precedex gtt , veersed gtt -> RASS -3 . Moves all 4s - yes.         PULMONARY Recent Labs  Lab 09/10/17 1120 09/10/17 1305 09/11/17 0946 09/12/17 0412 09/13/17 0416 09/14/17 2114  PHART 7.211* 7.394 7.594* 7.442 7.429 7.392  PCO2ART 73.8* 41.1 39.9 40.3 37.5 50.4*  PO2ART 162.0* 72.8* 62.0* 99.0 158.0* 145.0*  HCO3 29.5* 24.6 38.7* 27.4 24.9 30.6*  TCO2 32  --  40* 29 26 32   O2SAT 99.0 93.5 95.0 98.0 99.0 99.0    CBC Recent Labs  Lab 09/13/17 0606 09/14/17 0307 09/15/17 0332  HGB 13.1 11.8* 11.8*  HCT 41.1 37.0* 37.6*  WBC 20.9* 23.3* 19.4*  PLT 203 202 213    COAGULATION No results for input(s): INR in the last 168 hours.  CARDIAC  No results for input(s): TROPONINI in the last 168 hours. No results for input(s): PROBNP in the last 168 hours.   CHEMISTRY Recent Labs  Lab 09/11/17 2094 09/12/17 0342 09/13/17 0606 09/14/17 0000 09/14/17 0307 09/14/17 1106 09/14/17 2328 09/15/17 0332  NA 147* 144 142 143  --  144 142  --   K 3.0* 4.7 4.5 5.0  --  4.2 4.5  --   CL 102 108 108 105  --  106 104  --   CO2 38* _0 --  29 28  --   GLUCOSE 123* 123* 135* 134*  --  127* 150*  --   BUN 20 28* 39* 50*  --  51* 44*  --   CREATININE 2.19* 1.76* 1.38* 1.77*  --  1.78* 1.47*  --   CALCIUM 7.5* 8.0* 9.1 9.0  --  8.6* 8.7*  --   MG 3.0* 2.9* 3.1*  --  2.5*  --   --  2.6*  PHOS 1.7* 2.6 2.7  --  3.2  --   --  4.3   Estimated Creatinine Clearance: 68.7 mL/min (A) (by C-G formula based on SCr of 1.47 mg/dL (H)).   LIVER Recent Labs  Lab 09/14/17 0307 09/15/17 0332  AST 117* 61*  ALT 554* 385*  ALKPHOS 45 47  BILITOT 1.3* 1.0  PROT 6.1* 6.2*  ALBUMIN 2.3* 2.4*     INFECTIOUS Recent Labs  Lab 09/14/17 0000 09/14/17 1106 09/14/17 2328 09/15/17 0332  LATICACIDVEN 2.1*  --  1.7  --   PROCALCITON  --  0.33  --  0.23     ENDOCRINE CBG (last 3)  Recent Labs    09/14/17 2338 09/15/17 0353 09/15/17 0807  GLUCAP 133* 125* 128*       Recent Results (from the past 240 hour(s))  MRSA PCR Screening     Status: None   Collection Time: 09/09/17  8:31 PM  Result Value Ref Range Status   MRSA by PCR NEGATIVE NEGATIVE Final    Comment:        The GeneXpert MRSA Assay (FDA approved for NASAL specimens only), is one component of a comprehensive MRSA colonization surveillance program. It is not intended to diagnose  MRSA infection nor to guide or monitor  treatment for MRSA infections. Performed at [facility], [place] [address], [place], [place] [address]   Culture, bal-quantitative     Status: Abnormal (Preliminary result)   Collection Time: 09/11/17 10:56 AM  Result Value Ref Range Status   Specimen Description BRONCHIAL ALVEOLAR LAVAGE  Final   Special Requests NONE  Final   Gram Stain   Final    ABUNDANT WBC PRESENT, PREDOMINANTLY PMN FEW GRAM POSITIVE COCCI IN CLUSTERS    Culture (A)  Final    >=100,000 COLONIES/mL STAPHYLOCOCCUS AUREUS CULTURE REINCUBATED FOR BETTER GROWTH Performed at [facility], [place] [address] [place], [place] [address]    Report Status PENDING  Incomplete   Organism ID, Bacteria STAPHYLOCOCCUS AUREUS (A)  Final      Susceptibility   Staphylococcus aureus - MIC*    CIPROFLOXACIN <=0.5 SENSITIVE Sensitive     ERYTHROMYCIN <=0.25 SENSITIVE Sensitive     GENTAMICIN <=0.5 SENSITIVE Sensitive     OXACILLIN 0.5 SENSITIVE Sensitive     TETRACYCLINE <=1 SENSITIVE Sensitive     VANCOMYCIN <=0.5 SENSITIVE Sensitive     TRIMETH/SULFA <=10 SENSITIVE Sensitive     CLINDAMYCIN <=0.25 SENSITIVE Sensitive     RIFAMPIN <=0.5 SENSITIVE Sensitive     Inducible Clindamycin NEGATIVE Sensitive     * >=100,000 COLONIES/mL STAPHYLOCOCCUS AUREUS  Urine Culture     Status: None   Collection Time: 09/11/17 11:14 AM  Result Value Ref Range Status   Specimen Description URINE, CATHETERIZED  Final   Special Requests NONE  Final   Culture   Final    NO GROWTH Performed at [facility], [place] [address] [place], [place] [address]    Report Status 09/12/2017 FINAL  Final  Culture, blood (Routine X 2) w Reflex to ID Panel     Status: None (Preliminary result)   Collection Time: 09/11/17 11:45 AM  Result Value Ref Range Status   Specimen Description BLOOD RIGHT HAND  Final   Special Requests   Final    BOTTLES DRAWN AEROBIC ONLY Blood Culture adequate volume    Culture   Final    NO GROWTH 3 DAYS Performed at [facility], [place] [address] [place], [place] [address]    Report Status PENDING  Incomplete  Culture, blood (Routine X 2) w Reflex to ID Panel     Status: None (Preliminary result)   Collection Time: 09/11/17 11:48 AM  Result Value Ref Range Status   Specimen Description BLOOD RIGHT HAND  Final   Special Requests   Final    BOTTLES DRAWN AEROBIC ONLY Blood Culture adequate volume   Culture   Final    NO GROWTH 3 DAYS Performed at [facility], [place] [address], [place],  [address]    Report Status PENDING  Incomplete     IMAGING [phone]  - image(s) personally visualized  -   highlighted in bold Dg Chest Port 1 View  Result Date: 09/15/2017 CLINICAL DATA:  Endotracheal tube placement.  Ventilator support. EXAM: PORTABLE CHEST 1 VIEW COMPARISON:  09/14/2017 FINDINGS: Endotracheal tube is 5 cm above the carina. Nasogastric tube enters the abdomen. Heart size is normal. The lungs are clear except for minimal residual infiltrate in the left lower lobe. No worsening or new finding. No bone abnormality. IMPRESSION: Endotracheal tube and nasogastric tube well position. Mild left lower lobe pneumonia remains visible. No new or progressive finding. Electronically Signed   By: Jan Fireman.D.  On: 09/15/2017 07:50   Dg Chest Port 1 View  Result Date: 09/14/2017 CLINICAL DATA:  Intubation EXAM: PORTABLE CHEST 1 VIEW COMPARISON:  Portable exam 0959 hours compared to 09/13/2017 FINDINGS: Mildly rotated to the RIGHT. Tip of endotracheal tube projects 3.5 cm above carina. Nasogastric tube extends into stomach. Normal heart size, mediastinal contours and pulmonary vascularity. Central peribronchial thickening. Focal area of infiltrate at LEFT base. Remaining lungs clear. No pleural effusion or pneumothorax. Soft tissue gas identified at the RIGHT supraclavicular region, unchanged. No acute osseous findings. IMPRESSION: Bronchitic changes  with focus of infiltrate at LEFT lung base question pneumonia. Foci of soft tissue gas at the RIGHT supraclavicular region, unchanged since 09/12/2017. Electronically Signed   By: Lavonia Dana M.D.   On: 09/14/2017 10:35   Dg Chest Port 1 View  Result Date: 09/14/2017 CLINICAL DATA:  32 year old male status post intubation. EXAM: PORTABLE CHEST 1 VIEW COMPARISON:  Chest radiograph dated 09/13/2017 FINDINGS: Endotracheal tube above the carina in similar position as before and enteric tube extending to the left hemiabdomen with tip beyond the inferior margin of the image. Minimal bibasilar atelectatic changes. No focal consolidation. There is no pleural effusion or pneumothorax. The cardiac silhouette is within normal limits. No acute osseous pathology. IMPRESSION: No interval change. Electronically Signed   By: Anner Crete M.D.   On: 09/14/2017 00:25   Dg Abd Portable 1v  Result Date: 09/14/2017 CLINICAL DATA:  Vomiting EXAM: PORTABLE ABDOMEN - 1 VIEW COMPARISON:  Portable exam 1015 hours without priors for comparison FINDINGS: Tip of nasogastric tube projects over mid stomach. Nonobstructive bowel gas pattern. No bowel dilatation or bowel wall thickening. Osseous structures unremarkable. Lung bases clear. No urinary tract calcification. IMPRESSION: Normal exam. Electronically Signed   By: Lavonia Dana M.D.   On: 09/14/2017 10:32      DISCUSSION: This is a 32 y/o male with Acute Hypercapnic Respiratory failure secondary to Acute Asthma exacerbation requiring mechanical ventilation.  09/10/2017 transferred to [facility] for with long hospital.  Difficult air trapping despite neuromuscular blockade and heavy sedation multiple changes made to ventilator.  ASSESSMENT / PLAN:  PULMONARY A: Acute Hypercapnic Respiratory failure secondary to Asthma Exacerbation Hx: Asthma  09/15/2017 -  Unable to wean off vent due to heavy sedation need. Nimbex ended 09/14/17. Wheeze resolved 09/14/17 after  lasix x 1 day  P:   Full PCV vent support to conitnue Sterpids(helped wth wheeze) BD -  xopenex and atrovent q4h   CARDIOVASCULAR A:  Acute HTN, likely in setting of acute agitation and intubation  5/12 - intermittently hypertensive when agitaed  P:  Monitor    RENAL Lab Results  Component Value Date   CREATININE 1.47 (H) 09/14/2017   CREATININE 1.78 (H) 09/14/2017   CREATININE 1.77 (H) 09/14/2017   Recent Labs  Lab 09/14/17 0000 09/14/17 1106 09/14/17 2328  K 5.0 4.2 4.5   A:   Acute Kidney Injury Rhabdo - discovered 09/14/17 (? Cocaine v diprivan v both)  - AKI and rhabdo - improving after fluids for rhabdo, dc diprivan and dc vanc. Making good urine  P:   Avoid diprivan AVoid vanc if possible Chagne fluids to d5 LR at 50c/h   GASTROINTESTINAL A:   Vomit late night 09/13/17. TF on hold/. On 09/14/17 - transaminitis +  09/15/17 - vomit only during WUA with gag. LFT better.Lipase normal. Normal KUB. Suspect gag response  P:   Continue NPO  Start trickle feeds protonix Check KUB, amylase, lipase,  later on 09/14/17   HEMATOLOGIC Recent Labs    09/14/17 0307 09/15/17 0332  HGB 11.8* 11.8*   A:   No acute issues  P:  Transfuse per ICU protocol SQ heparin for DVT prophylaxis - PRBC for hgb </= 6.9gm%    - exceptions are   -  if ACS susepcted/confirmed then transfuse for hgb </= 8.0gm%,  or    -  active bleeding with hemodynamic instability, then transfuse regardless of hemoglobin value   At at all times try to transfuse 1 unit prbc as possible with exception of active hemorrhage   INFECTIOUS A:   STaph in BAL 09/11/17. MSSA speciation 09/15/17  P:   startg ancef Anti-infectives (From admission, onward)   Start     Dose/Rate Route Frequency Ordered Stop   09/14/17 2000  linezolid (ZYVOX) IVPB 600 mg     600 mg 300 mL/hr over 60 Minutes Intravenous Every 12 hours 09/14/17 0959     09/13/17 0900  vancomycin (VANCOCIN) IVPB 1000 mg/200 mL premix   Status:  Discontinued     1,000 mg 200 mL/hr over 60 Minutes Intravenous Every 12 hours 09/13/17 0823 09/14/17 0959   09/12/17 1200  vancomycin (VANCOCIN) 1,250 mg in sodium chloride 0.9 % 250 mL IVPB  Status:  Discontinued     1,250 mg 166.7 mL/hr over 90 Minutes Intravenous Every 24 hours 09/11/17 1050 09/13/17 0823   09/11/17 1800  piperacillin-tazobactam (ZOSYN) IVPB 3.375 g  Status:  Discontinued     3.375 g 12.5 mL/hr over 240 Minutes Intravenous Every 8 hours 09/11/17 1050 09/11/17 1055   09/11/17 1200  vancomycin (VANCOCIN) 1,500 mg in sodium chloride 0.9 % 500 mL IVPB     1,500 mg 250 mL/hr over 120 Minutes Intravenous  Once 09/11/17 1050 09/11/17 1435   09/11/17 1200  piperacillin-tazobactam (ZOSYN) IVPB 3.375 g  Status:  Discontinued     3.375 g 100 mL/hr over 30 Minutes Intravenous  Once 09/11/17 1050 09/11/17 1055   09/11/17 1200  ceFEPIme (MAXIPIME) 2 g in sodium chloride 0.9 % 100 mL IVPB  Status:  Discontinued     2 g 200 mL/hr over 30 Minutes Intravenous Every 12 hours 09/11/17 1055 09/13/17 1014       ENDOCRINE A:   CBG (last 3)  Recent Labs    09/14/17 2338 09/15/17 0353 09/15/17 0807  GLUCAP 133* 125* 128*   No acute issues    P:   ISS CBGs  NEUROLOGIC A:   Acute Encephalopathy likely in setting of Hypercapnia and sedation  - 09/15/2017 - Off nimbex x 24h but needing 3 gtt fent, precedex and versed to maintain calm. Diprivan stopped due to high CK P:   Avoid diprivan continue versed gtt Continue fent gtt RAS goal 0 to 02  FAMILY  - Updates: Mother updated bedside 5/9. None at bedside 09/13/17 and 09/14/17 None at bedside 09/15/17  - Inter-disciplinary family meet or Palliative Care meeting due by:  09/16/17    The patient is critically ill with multiple organ systems failure and requires high complexity decision making for assessment and support, frequent evaluation and titration of therapies, application of advanced monitoring technologies and  extensive interpretation of multiple databases.   Critical Care Time devoted to patient care services described in this note is  30  Minutes. This time reflects time of care of this signee Dr Brand Males. This critical care time does not reflect procedure time, or teaching time or supervisory time of PA/NP/Med  student/Med Resident etc but could involve care discussion time    Dr. Brand Males, M.D., [facility].C.P Pulmonary and Critical Care Medicine Staff Physician [facility] Pager: [phone], If no answer or between  15:00h - 7:00h: call [phone]  09/15/2017 9:50 AM

## 2017-09-16 ENCOUNTER — Inpatient Hospital Stay (HOSPITAL_COMMUNITY): Payer: Self-pay

## 2017-09-16 DIAGNOSIS — R001 Bradycardia, unspecified: Secondary | ICD-10-CM

## 2017-09-16 LAB — GLUCOSE, CAPILLARY
GLUCOSE-CAPILLARY: 117 mg/dL — AB (ref 65–99)
GLUCOSE-CAPILLARY: 202 mg/dL — AB (ref 65–99)
GLUCOSE-CAPILLARY: 101 mg/dL — AB (ref 65–99)
Glucose-Capillary: 119 mg/dL — ABNORMAL HIGH (ref 65–99)
Glucose-Capillary: 125 mg/dL — ABNORMAL HIGH (ref 65–99)
Glucose-Capillary: 148 mg/dL — ABNORMAL HIGH (ref 65–99)
Glucose-Capillary: 86 mg/dL (ref 65–99)

## 2017-09-16 LAB — MAGNESIUM: Magnesium: 2.5 mg/dL — ABNORMAL HIGH (ref 1.7–2.4)

## 2017-09-16 LAB — BASIC METABOLIC PANEL
Anion gap: 7 (ref 5–15)
BUN: 38 mg/dL — ABNORMAL HIGH (ref 6–20)
CALCIUM: 8.9 mg/dL (ref 8.9–10.3)
CO2: 26 mmol/L (ref 22–32)
CREATININE: 1.29 mg/dL — AB (ref 0.61–1.24)
Chloride: 111 mmol/L (ref 101–111)
GFR calc non Af Amer: >60 mL/min (ref >60)
GLUCOSE: 170 mg/dL — AB (ref 65–99)
Potassium: 3.8 mmol/L (ref 3.5–5.1)
Sodium: 144 mmol/L (ref 135–145)

## 2017-09-16 LAB — CK TOTAL AND CKMB (NOT AT ARMC)
CK TOTAL: 1389 U/L — AB (ref 49–397)
CK, MB: 14.4 ng/mL — AB (ref 0.5–5.0)
RELATIVE INDEX: 1 (ref 0.0–2.5)

## 2017-09-16 LAB — HEPATIC FUNCTION PANEL
ALBUMIN: 2.4 g/dL — AB (ref 3.5–5.0)
ALT: 279 U/L — ABNORMAL HIGH (ref 17–63)
AST: 70 U/L — ABNORMAL HIGH (ref 15–41)
Alkaline Phosphatase: 40 U/L (ref 38–126)
Bilirubin, Direct: 0.2 mg/dL (ref 0.1–0.5)
Indirect Bilirubin: 0.6 mg/dL (ref 0.3–0.9)
Total Bilirubin: 0.8 mg/dL (ref 0.3–1.2)
Total Protein: 5.8 g/dL — ABNORMAL LOW (ref 6.5–8.1)

## 2017-09-16 LAB — CULTURE, BLOOD (ROUTINE X 2)
CULTURE: NO GROWTH
Culture: NO GROWTH
Special Requests: ADEQUATE
Special Requests: ADEQUATE

## 2017-09-16 LAB — CBC WITH DIFFERENTIAL/PLATELET
BASOS ABS: 0 10*3/uL (ref 0.0–0.1)
Basophils Relative: 0 %
Eosinophils Absolute: 0 10*3/uL (ref 0.0–0.7)
Eosinophils Relative: 0 %
HEMATOCRIT: 37.2 % — AB (ref 39.0–52.0)
HEMOGLOBIN: 11.6 g/dL — AB (ref 13.0–17.0)
LYMPHS PCT: 21 %
Lymphs Abs: 3 10*3/uL (ref 0.7–4.0)
MCH: 32 pg (ref 26.0–34.0)
MCHC: 31.2 g/dL (ref 30.0–36.0)
MCV: 102.5 fL — ABNORMAL HIGH (ref 78.0–100.0)
MONOS PCT: 7 %
Monocytes Absolute: 1 10*3/uL (ref 0.1–1.0)
NEUTROS PCT: 72 %
Neutro Abs: 10.3 10*3/uL — ABNORMAL HIGH (ref 1.7–7.7)
Platelets: UNDETERMINED 10*3/uL (ref 150–400)
RBC: 3.63 MIL/uL — AB (ref 4.22–5.81)
RDW: 12.7 % (ref 11.5–15.5)
WBC: 14.3 10*3/uL — AB (ref 4.0–10.5)

## 2017-09-16 LAB — PHOSPHORUS: PHOSPHORUS: 2.7 mg/dL (ref 2.5–4.6)

## 2017-09-16 LAB — PROCALCITONIN

## 2017-09-16 MED ORDER — LEVALBUTEROL HCL 0.63 MG/3ML IN NEBU
0.6300 mg | INHALATION_SOLUTION | Freq: Four times a day (QID) | RESPIRATORY_TRACT | Status: DC
  Administered 2017-09-17: 0.63 mg via RESPIRATORY_TRACT
  Filled 2017-09-16 (×2): qty 3

## 2017-09-16 MED ORDER — DM-GUAIFENESIN ER 30-600 MG PO TB12
1.0000 | ORAL_TABLET | Freq: Two times a day (BID) | ORAL | Status: DC
  Filled 2017-09-16: qty 1

## 2017-09-16 MED ORDER — DM-GUAIFENESIN ER 30-600 MG PO TB12
1.0000 | ORAL_TABLET | Freq: Two times a day (BID) | ORAL | Status: DC
  Administered 2017-09-16 – 2017-09-18 (×3): 1 via ORAL
  Filled 2017-09-16 (×5): qty 1

## 2017-09-16 MED ORDER — IPRATROPIUM BROMIDE 0.02 % IN SOLN
0.5000 mg | Freq: Four times a day (QID) | RESPIRATORY_TRACT | Status: DC
  Administered 2017-09-17: 0.5 mg via RESPIRATORY_TRACT
  Filled 2017-09-16: qty 2.5

## 2017-09-16 MED ORDER — HALOPERIDOL LACTATE 5 MG/ML IJ SOLN
2.0000 mg | INTRAMUSCULAR | Status: DC | PRN
  Administered 2017-09-16: 2 mg via INTRAVENOUS
  Filled 2017-09-16: qty 1

## 2017-09-16 NOTE — Progress Notes (Signed)
50 mL fentanyl, 40mL versed wasted in sink. Witnessed by Acey Lav, RN

## 2017-09-16 NOTE — Progress Notes (Signed)
eLink Physician-Brief Progress Note Patient Name: Jeremy Mccormick DOB: Sep 11, 1985 MRN: [id number]   Date of Service  09/16/2017  HPI/Events of Note  Agitation   eICU Interventions  Will order: 1. Will increase ceiling on Precedex IV infusion to 1.5 mcg/kg/hour.      Intervention Category Minor Interventions: Agitation / anxiety - evaluation and management  Stepheny Canal Eugene 09/16/2017, 1:56 AM

## 2017-09-16 NOTE — Progress Notes (Signed)
Called by bedside RN, patient is combative and threatening to the nursing staff and very disrespectful.  I went and spoke with the patient.  He is oriented to self, place, time, president and name of his siblings.  He is stating that he feels fine and wants to go home.  He understands that we can not discharge him as he just got extubated and him leaving is very dangerous and risky to his life.  He states he is willing to take that risk.  He states that he has the right to refuse care and since he is oriented.    I informed him that if he signs the AMA papers he is free to go.  Mother became very irate at this point and insisted that he stays.  I told them that we can not make him stay against his will and he asked that we leave the room.  We left him with his mother and the Recruitment consultant.    He is free to leave after signing AMA papers and at this point the decision is between him and his mother.  Alyson Reedy, M.D. [facility] Pulmonary/Critical Care Medicine. Pager: [phone]. After hours pager: [phone].

## 2017-09-16 NOTE — Progress Notes (Signed)
PULMONARY / CRITICAL CARE MEDICINE   Name: Jeremy Mccormick MRN: [id number] DOB: [birth year]    ADMISSION DATE:  09/09/2017 CONSULTATION DATE:  09/09/2017  REFERRING MD:  Dr. Ellender Hose  CHIEF COMPLAINT:  Wheezing and Shortness of Breath  BRIEF Mr. Belay is a 32 y/o male with a PMH of asthma.  He presents to [facility] ED on 5/6 with c/o acute onset of wheezing, shortness of breath, and chest tightness.  He began to have complaints of expiratory wheezing and productive cough with clear sputum since 5/5.  It was noted that he has been out of his inhalers .  Pt is currently intubated and sedated, with no family present, therefore history is obtained from ED notes.  In the ED pt received continuous Albuterol treatment, magnesium IV, Solu-Medrol IV, and Epinephrine, however continued to experience respiratory distress.  Trial of BiPap was not tolerated, and pt was subsequently intubated in the ED.  CXR is not concerning for any acute Pulmonary disease.   PCCM is consulted for admission for Acute Hypercapnic Respiratory Failure secondary to Asthma exacerbation requiring mechanical ventilation.   SIGNIFICANT EVENTS: 5/6>> Presents to [name] ED with Acute Hypercapnic Respiratory distress requiring Mechanical Ventilation and Admission to [facility] ICU 5/6>>ETT 5/6>> Foley catheter 57/19 - UDS positivie cocaine, THC  And benzo 5/8 - BAL lung with staph > 100K. MSSA 5/ 9 - sedated and paralyzed, double clutching, no further events overnight 5/10 - back on nimbex. Wheezy, Hypertensive On vent. On diprivan gtt. femt gtt. Abx narrowed to vanc  5/11/1 9 - Lactate 2.1 and CK 6,663 - and very high. LFT up, Mild AKI. Unable tto get off nimbex due to vent dysnchrony. Being diuresed since  yesterday without improvement in vent dysnchrony but wheeze has resolved. Did vomit yesterday and this AM low grade fever with rising wbc. TF on hold. BIS 40s   SUBJECTIVE/OVERNIGHT/INTERVAL HX Very agitated on precedex,  versed and fentanyl drips, not purposeful, no further events overnight  VITAL SIGNS: BP (!) 144/89   Pulse (!) 51   Temp 98.1 F (36.7 C) (Oral)   Resp 16   Ht 5' 7.01" (1.702 m)   Wt 163 lb 9.3 oz (74.2 kg)   SpO2 98%   BMI 25.61 kg/m   HEMODYNAMICS:    VENTILATOR SETTINGS: Vent Mode: PCV FiO2 (%):  [40 %-50 %] 40 % Set Rate:  [16 bmp] 16 bmp PEEP:  [5 cmH20] 5 cmH20 Pressure Support:  [8 cmH20] 8 cmH20 Plateau Pressure:  [13 cmH20-20 cmH20] 13 cmH20  INTAKE / OUTPUT: I/O last 3 completed shifts: In: 5220.9 [I.V.:4730.9; NG/GT:490] Out: 3383 [ANVBT:6606; Emesis/NG output:550]  PHYSICAL EXAMINATION:  General Appearance:    Acutely ill appearing male, NAD, sedate  Head:    [place]/AT  Eyes:    PERRL, EOM-I and MMM    Ears:    WNL  Nose:   Intact  Throat:  ETT in place  Neck:   Supple, no masses     Lungs:     CTA bilaterally  Chest wall:    Intact  Heart:    RRR, Nl S1/S2 and -M/R/G  Abdomen:     Soft, NT, ND and +BS        Extremities:   -edema and -tenderness     Skin:   Intact      Neurologic:   Heavily sedated on versed, fentanyl and precedex    PULMONARY Recent Labs  Lab 09/10/17 1120 09/10/17 1305 09/11/17 0946 09/12/17 0045  09/13/17 0416 09/14/17 2114  PHART 7.211* 7.394 7.594* 7.442 7.429 7.392  PCO2ART 73.8* 41.1 39.9 40.3 37.5 50.4*  PO2ART 162.0* 72.8* 62.0* 99.0 158.0* 145.0*  HCO3 29.5* 24.6 38.7* 27.4 24.9 30.6*  TCO2 32  --  40* 29 26 32  O2SAT 99.0 93.5 95.0 98.0 99.0 99.0   CBC Recent Labs  Lab 09/14/17 0307 09/15/17 0332 09/16/17 0512  HGB 11.8* 11.8* 11.6*  HCT 37.0* 37.6* 37.2*  WBC 23.3* 19.4* 14.3*  PLT 202 213 PLATELET CLUMPS NOTED ON SMEAR, UNABLE TO ESTIMATE   COAGULATION No results for input(s): INR in the last 168 hours.  CARDIAC  No results for input(s): TROPONINI in the last 168 hours. No results for input(s): PROBNP in the last 168 hours.  CHEMISTRY Recent Labs  Lab 09/12/17 0342 09/13/17 0606  09/14/17 0000 09/14/17 0307 09/14/17 1106 09/14/17 2328 09/15/17 0332 09/16/17 0031 09/16/17 0512  NA 144 142 143  --  144 142  --  144  --   K 4.7 4.5 5.0  --  4.2 4.5  --  3.8  --   CL 108 108 105  --  106 104  --  111  --   CO2 _0 --  29 28  --  26  --   GLUCOSE 123* 135* 134*  --  127* 150*  --  170*  --   BUN 28* 39* 50*  --  51* 44*  --  38*  --   CREATININE 1.76* 1.38* 1.77*  --  1.78* 1.47*  --  1.29*  --   CALCIUM 8.0* 9.1 9.0  --  8.6* 8.7*  --  8.9  --   MG 2.9* 3.1*  --  2.5*  --   --  2.6*  --  2.5*  PHOS 2.6 2.7  --  3.2  --   --  4.3  --  2.7   Estimated Creatinine Clearance: 78.3 mL/min (A) (by C-G formula based on SCr of 1.29 mg/dL (H)).  LIVER Recent Labs  Lab 09/14/17 0307 09/15/17 0332 09/16/17 0512  AST 117* 61* 70*  ALT 554* 385* 279*  ALKPHOS 45 47 40  BILITOT 1.3* 1.0 0.8  PROT 6.1* 6.2* 5.8*  ALBUMIN 2.3* 2.4* 2.4*   INFECTIOUS Recent Labs  Lab 09/14/17 0000 09/14/17 1106 09/14/17 2328 09/15/17 0332 09/16/17 0512  LATICACIDVEN 2.1*  --  1.7  --   --   PROCALCITON  --  0.33  --  0.23 <0.10   ENDOCRINE CBG (last 3)  Recent Labs    09/16/17 0005 09/16/17 0406 09/16/17 0748  GLUCAP 148* 119* 117*   Recent Results (from the past 240 hour(s))  MRSA PCR Screening     Status: None   Collection Time: 09/09/17  8:31 PM  Result Value Ref Range Status   MRSA by PCR NEGATIVE NEGATIVE Final    Comment:        The GeneXpert MRSA Assay (FDA approved for NASAL specimens only), is one component of a comprehensive MRSA colonization surveillance program. It is not intended to diagnose MRSA infection nor to guide or monitor treatment for MRSA infections. Performed at [facility], [place] [address], [place], [place] [address]   Culture, bal-quantitative     Status: Abnormal   Collection Time: 09/11/17 10:56 AM  Result Value Ref Range Status   Specimen Description BRONCHIAL ALVEOLAR LAVAGE  Final   Special Requests  NONE  Final   Gram Stain  Final    ABUNDANT WBC PRESENT, PREDOMINANTLY PMN FEW GRAM POSITIVE COCCI IN CLUSTERS    Culture (A)  Final    >=100,000 COLONIES/mL STAPHYLOCOCCUS AUREUS >=100,000 COLONIES/mL HAEMOPHILUS INFLUENZAE BETA LACTAMASE NEGATIVE Performed at [facility], [place] [address] [place], [place] [address]    Report Status 09/15/2017 FINAL  Final   Organism ID, Bacteria STAPHYLOCOCCUS AUREUS (A)  Final      Susceptibility   Staphylococcus aureus - MIC*    CIPROFLOXACIN <=0.5 SENSITIVE Sensitive     ERYTHROMYCIN <=0.25 SENSITIVE Sensitive     GENTAMICIN <=0.5 SENSITIVE Sensitive     OXACILLIN 0.5 SENSITIVE Sensitive     TETRACYCLINE <=1 SENSITIVE Sensitive     VANCOMYCIN <=0.5 SENSITIVE Sensitive     TRIMETH/SULFA <=10 SENSITIVE Sensitive     CLINDAMYCIN <=0.25 SENSITIVE Sensitive     RIFAMPIN <=0.5 SENSITIVE Sensitive     Inducible Clindamycin NEGATIVE Sensitive     * >=100,000 COLONIES/mL STAPHYLOCOCCUS AUREUS  Urine Culture     Status: None   Collection Time: 09/11/17 11:14 AM  Result Value Ref Range Status   Specimen Description URINE, CATHETERIZED  Final   Special Requests NONE  Final   Culture   Final    NO GROWTH Performed at [facility], [place] [address] [place], [place] [address]    Report Status 09/12/2017 FINAL  Final  Culture, blood (Routine X 2) w Reflex to ID Panel     Status: None (Preliminary result)   Collection Time: 09/11/17 11:45 AM  Result Value Ref Range Status   Specimen Description BLOOD RIGHT HAND  Final   Special Requests   Final    BOTTLES DRAWN AEROBIC ONLY Blood Culture adequate volume   Culture   Final    NO GROWTH 4 DAYS Performed at [facility], [place] [address] [place], [place] [address]    Report Status PENDING  Incomplete  Culture, blood (Routine X 2) w Reflex to ID Panel     Status: None (Preliminary result)   Collection Time: 09/11/17 11:48 AM  Result Value Ref Range Status   Specimen Description BLOOD  RIGHT HAND  Final   Special Requests   Final    BOTTLES DRAWN AEROBIC ONLY Blood Culture adequate volume   Culture   Final    NO GROWTH 4 DAYS Performed at [facility], [place] [address] [place], [place] [address]    Report Status PENDING  Incomplete   IMAGING [phone]  - image(s) personally visualized  -   highlighted in bold Dg Chest Port 1 View  Result Date: 09/16/2017 CLINICAL DATA:  Shortness of breath EXAM: PORTABLE CHEST 1 VIEW COMPARISON:  09/15/2017 FINDINGS: Cardiac shadow is stable. Endotracheal tube and nasogastric catheter are noted in satisfactory position. The lungs are well aerated bilaterally. Improved aeration left base is noted. No bony abnormality is seen. IMPRESSION: Tubes and lines as described above. No focal infiltrate is noted. Electronically Signed   By: Inez Catalina M.D.   On: 09/16/2017 07:09   Dg Chest Port 1 View  Result Date: 09/15/2017 CLINICAL DATA:  Endotracheal tube placement.  Ventilator support. EXAM: PORTABLE CHEST 1 VIEW COMPARISON:  09/14/2017 FINDINGS: Endotracheal tube is 5 cm above the carina. Nasogastric tube enters the abdomen. Heart size is normal. The lungs are clear except for minimal residual infiltrate in the left lower lobe. No worsening or new finding. No bone abnormality. IMPRESSION: Endotracheal tube and nasogastric tube well position. Mild left  lower lobe pneumonia remains visible. No new or progressive finding. Electronically Signed   By: Nelson Chimes M.D.   On: 09/15/2017 07:50      DISCUSSION: This is a 32 y/o male with Acute Hypercapnic Respiratory failure secondary to Acute Asthma exacerbation requiring mechanical ventilation.  09/10/2017 transferred to [facility] for with long hospital.  Difficult air trapping despite neuromuscular blockade and heavy sedation multiple changes made to ventilator.  ASSESSMENT / PLAN:  PULMONARY A: Acute Hypercapnic Respiratory failure secondary to Asthma Exacerbation Hx:  Asthma  09/16/2017 -  Unable to wean off vent due to heavy sedation need. Nimbex ended 09/14/17. Wheeze resolved 09/14/17 after lasix x 1 day  P:   Hold all sedation and attempt extubation today if purposeful and breathing as pressures and FiO2 are much improved and wheezing has resovled Sterpids (helped wth wheeze) BD -  xopenex and atrovent q4h If fails then will re-intubate and will have to start PO agents  CARDIOVASCULAR A:  Acute HTN, likely in setting of acute agitation and intubation Bradycardia overnight, likely sedation and precedex related P:  Tele monitoring If HTN remains once extubated then will consider oral agents  RENAL Lab Results  Component Value Date   CREATININE 1.29 (H) 09/16/2017   CREATININE 1.47 (H) 09/14/2017   CREATININE 1.78 (H) 09/14/2017   Recent Labs  Lab 09/14/17 1106 09/14/17 2328 09/16/17 0031  K 4.2 4.5 3.8   A:   Acute Kidney Injury Rhabdo - discovered 09/14/17 (? Cocaine v diprivan v both)  P:   Avoid vanc if possible KVO IVF BMET in AM Replace electrolytes as indicated  GASTROINTESTINAL A:   Vomit late night 09/13/17. TF on hold/. On 09/14/17 - transaminitis +  09/15/17 - vomit only during WUA with gag. LFT better.Lipase normal. Normal KUB. Suspect gag response  P:   Continue NPO  TF is remains intubated Protonix  HEMATOLOGIC Recent Labs    09/15/17 0332 09/16/17 0512  HGB 11.8* 11.6*   A:   No acute issues  P:  CBC in AM Transfuse per ICU protocol  INFECTIOUS A:   Staph in BAL 09/11/17. MSSA speciation 09/15/17  P:   startg ancef Anti-infectives (From admission, onward)   Start     Dose/Rate Route Frequency Ordered Stop   09/15/17 1100  ceFAZolin (ANCEF) IVPB 2g/100 mL premix     2 g 200 mL/hr over 30 Minutes Intravenous Every 8 hours 09/15/17 0956     09/14/17 2000  linezolid (ZYVOX) IVPB 600 mg  Status:  Discontinued     600 mg 300 mL/hr over 60 Minutes Intravenous Every 12 hours 09/14/17 0959 09/15/17 0949    09/13/17 0900  vancomycin (VANCOCIN) IVPB 1000 mg/200 mL premix  Status:  Discontinued     1,000 mg 200 mL/hr over 60 Minutes Intravenous Every 12 hours 09/13/17 0823 09/14/17 0959   09/12/17 1200  vancomycin (VANCOCIN) 1,250 mg in sodium chloride 0.9 % 250 mL IVPB  Status:  Discontinued     1,250 mg 166.7 mL/hr over 90 Minutes Intravenous Every 24 hours 09/11/17 1050 09/13/17 0823   09/11/17 1800  piperacillin-tazobactam (ZOSYN) IVPB 3.375 g  Status:  Discontinued     3.375 g 12.5 mL/hr over 240 Minutes Intravenous Every 8 hours 09/11/17 1050 09/11/17 1055   09/11/17 1200  vancomycin (VANCOCIN) 1,500 mg in sodium chloride 0.9 % 500 mL IVPB     1,500 mg 250 mL/hr over 120 Minutes Intravenous  Once 09/11/17 1050 09/11/17 1435  09/11/17 1200  piperacillin-tazobactam (ZOSYN) IVPB 3.375 g  Status:  Discontinued     3.375 g 100 mL/hr over 30 Minutes Intravenous  Once 09/11/17 1050 09/11/17 1055   09/11/17 1200  ceFEPIme (MAXIPIME) 2 g in sodium chloride 0.9 % 100 mL IVPB  Status:  Discontinued     2 g 200 mL/hr over 30 Minutes Intravenous Every 12 hours 09/11/17 1055 09/13/17 1014     ENDOCRINE A:   CBG (last 3)  Recent Labs    09/16/17 0005 09/16/17 0406 09/16/17 0748  GLUCAP 148* 119* 117*   Hyperglycemia with steroids    P:   ISS CBGs  NEUROLOGIC A:   Acute Encephalopathy likely in setting of Hypercapnia and sedation  - 09/16/2017 - Off nimbex x 24h but needing 3 gtt fent, precedex and versed to maintain calm. Diprivan stopped due to high CK P:   Avoid diprivan Hold all sedation for attempt at extubation today, if fails then will start drips back and add seroquel 50 mg PO BID and Klonipin 1 mg PO BID.  FAMILY  - Updates: No family bedside 5/13 to update  - Inter-disciplinary family meet or Palliative Care meeting due by:  09/16/17  The patient is critically ill with multiple organ systems failure and requires high complexity decision making for assessment and  support, frequent evaluation and titration of therapies, application of advanced monitoring technologies and extensive interpretation of multiple databases.   Critical Care Time devoted to patient care services described in this note is  37  Minutes. This time reflects time of care of this signee Dr Jennet Maduro. This critical care time does not reflect procedure time, or teaching time or supervisory time of PA/NP/Med student/Med Resident etc but could involve care discussion time.  Rush Farmer, M.D. [facility] Pulmonary/Critical Care Medicine. Pager: [phone]. After hours pager: [phone].  09/16/2017 11:04 AM

## 2017-09-16 NOTE — Procedures (Signed)
Extubation Procedure Note  Patient Details:   Name: Jeremy Mccormick DOB: [birth year] MRN: [id number]   Airway Documentation:    Vent end date: 09/16/17 Vent end time: 1150   Evaluation  O2 sats: stable throughout Complications: No apparent complications Patient did tolerate procedure well. Bilateral Breath Sounds: Clear   Patient was extubated to a 4L [place]. Cuff leak was heard. No stridor was noted. Patient tolerated well. RN and MD at bedside with RT during extubation.    Yes  Darolyn Rua 09/16/2017, 11:55 AM

## 2017-09-16 NOTE — Progress Notes (Signed)
Patient extremely agitated.Pulling at IVs. Refusing meds and nursing care. States "Im going home and you can't fucking stop me" and "if you don't get out of my fucking face I'll throw this cup of ice on you."  Pt instructed that he was taken off the ventilator this morning and he was not ready for disharge yet. RN informed him that he was too weak to stand. Safety sitter at bedside. Patients mom at bedside.Bed alarm on.   RN paged MD to bedside.

## 2017-09-16 NOTE — Progress Notes (Signed)
eLink Physician-Brief Progress Note Patient Name: Jeremy Mccormick DOB: [birth year] MRN: [id number]   Date of Service  09/16/2017  HPI/Events of Note  Agitated Delirium - QTc inteval = 0.431 seconds.   eICU Interventions  Will order: 1. Haldol 2 mg IV Q 3 hours PRN agitation.      Intervention Category Major Interventions: Delirium, psychosis, severe agitation - evaluation and management  Sommer,Steven Eugene 09/16/2017, 10:46 PM

## 2017-09-17 LAB — GLUCOSE, CAPILLARY
GLUCOSE-CAPILLARY: 92 mg/dL (ref 65–99)
Glucose-Capillary: 104 mg/dL — ABNORMAL HIGH (ref 65–99)
Glucose-Capillary: 79 mg/dL (ref 65–99)
Glucose-Capillary: 116 mg/dL — ABNORMAL HIGH (ref 65–99)

## 2017-09-17 MED ORDER — SODIUM CHLORIDE 0.9 % IV BOLUS
500.0000 mL | Freq: Once | INTRAVENOUS | Status: AC
  Administered 2017-09-17: 500 mL via INTRAVENOUS

## 2017-09-17 MED ORDER — LORAZEPAM 2 MG/ML IJ SOLN
2.0000 mg | INTRAMUSCULAR | Status: DC | PRN
  Administered 2017-09-17: 2 mg via INTRAVENOUS
  Administered 2017-09-18: 3 mg via INTRAVENOUS
  Filled 2017-09-17: qty 1
  Filled 2017-09-17: qty 2

## 2017-09-17 MED ORDER — HALOPERIDOL LACTATE 5 MG/ML IJ SOLN
2.0000 mg | Freq: Four times a day (QID) | INTRAMUSCULAR | Status: DC | PRN
  Administered 2017-09-17: 2 mg via INTRAVENOUS
  Filled 2017-09-17 (×2): qty 1

## 2017-09-17 MED ORDER — HALOPERIDOL LACTATE 5 MG/ML IJ SOLN: 1.0000 mg | Freq: Four times a day (QID) | INTRAMUSCULAR | Status: DC | PRN

## 2017-09-17 MED ORDER — LEVALBUTEROL HCL 0.63 MG/3ML IN NEBU
0.6300 mg | INHALATION_SOLUTION | Freq: Two times a day (BID) | RESPIRATORY_TRACT | Status: DC
  Administered 2017-09-17: 0.63 mg via RESPIRATORY_TRACT
  Filled 2017-09-17 (×3): qty 3

## 2017-09-17 MED ORDER — PROMETHAZINE HCL 25 MG/ML IJ SOLN
12.5000 mg | Freq: Three times a day (TID) | INTRAMUSCULAR | Status: DC | PRN
Start: 2017-09-17 — End: 2017-09-19
  Administered 2017-09-17 – 2017-09-18 (×3): 12.5 mg via INTRAVENOUS
  Filled 2017-09-17 (×3): qty 1

## 2017-09-17 MED ORDER — SODIUM CHLORIDE 0.9 % IV SOLN
INTRAVENOUS | Status: DC
  Administered 2017-09-17: 13:00:00 via INTRAVENOUS

## 2017-09-17 MED ORDER — IPRATROPIUM BROMIDE 0.02 % IN SOLN
0.5000 mg | Freq: Two times a day (BID) | RESPIRATORY_TRACT | Status: DC
  Administered 2017-09-17: 0.5 mg via RESPIRATORY_TRACT
  Filled 2017-09-17: qty 2.5

## 2017-09-17 NOTE — Progress Notes (Signed)
Pt continues to having vomiting after any oral intake. Pt denies nausea before any oral intake. No indication of aspiration.  Pt placed NPO until MD rounds on patient. Will continue to monitor

## 2017-09-17 NOTE — Progress Notes (Signed)
Patient refused morning labs.

## 2017-09-17 NOTE — Progress Notes (Signed)
PROGRESS NOTE    Jeremy Mccormick  XFG:[id number] DOB: [birth year] DOA: 09/09/2017 PCP: Patient, No Pcp Per      Brief Narrative:  Jeremy Mccormick is a 32 y.o. M with asthma and polysubstance abuse with wheezing, shortness of breath, and chest tightness that had progressed over several days, in the context of smoking crack running out of his inhalers.  Respiratory failure in the ER was not responsive to albuterol, magnesium, Solu-Medrol, or epinephrine, and so he was intubated.  Chest x-ray initially clear.   Assessment & Plan:  Hypoxic and hypercapnic respiratory failure Severe asthma exacerbation -Continue Solu-Medrol twice daily -start famotidine twice daily -Continue scheduled nebulizers -Add PRN bronchodilators -Continue Robitussin -Wean supplemental O2 to keep >92% but less than 96%  MSSA pneumonia Bronch on hospital day 3.  Cultures with Hflu and MSSA. -Continue cefazolin -Repeat CXR tomorrow   Acute metabolic encephalopathy Presumed from ICU delirium/sedatives.  Doubt drug or alcohol withdrawal. -IV haldol 2 mg for agitation  Rhabdomyolysis  Acute kidney injury Baseline unknown, 1.0 at admission?  Peaked at 2, currently back to 1.2 mg/dL. -Continue IV Fluids -Trend CK  Post-tussive emesis -Increase PRN bronchodilators -Will have SLP eval, but I doubt there is anything for them to offer  Transaminitis Perhaps underlying liver disease.  If not, presume from rhabdo.  HIV engative. -Check hep serologies     DVT prophylaxis: Heparin Code Status: FULL Family Communication: None present MDM and disposition Plan: The below labs and imaging reports were reviewed.  The patient's status is clinically improving.  He was admitted with acute hypoxic and hypercapnic respiratory failure from asthma.  He required intubation.  Subsequently he was found to have MSSA pneumonia.  He is on cefazolin.  He is slowly improving but is course has been complicated by agitation, acute metabolic  encephalopathy, and desire to leave AMA.   Consultants:   Critical care  Procedures:   Bronchoscopy 5/9 Airway entered and the following bronchi were examined: RUL, RML, RLL, LUL, LLL and Bronchi.   Bibasilar purulent secretion noted, no masses or foreign bodies Bronchoscope removed.       Antimicrobials:   Vancomycin 5/8 >> 5/11  Cefepime 5/8 >> 5/10  Linezolid 5/11 >> 5/12   Cefazolin 5/12 >>   Subjective: Awake, alert.  Slow to respond, sleepy, but oriented to location, situation, month.  No chest pain, no respiratory distress.  No nausea.  Coughs frequently with any oral intake, leading to vomiting.  No confusion, fever.  Objective: Vitals:   09/17/17 0500 09/17/17 0600 09/17/17 0726 09/17/17 0847  BP: (!) 129/104 130/70 (!) 162/91   Pulse: 89 75 64   Resp:  (!) 38 (!) 36   Temp:      TempSrc:      SpO2: 98% 96% 98% 99%  Weight:      Height:        Intake/Output Summary (Last 24 hours) at 09/17/2017 1117 Last data filed at 09/17/2017 0800 Gross per 24 hour  Intake 1110 ml  Output 1501 ml  Net -391 ml   Filed Weights   09/15/17 0401 09/16/17 0417 09/17/17 0409  Weight: 73.5 kg (162 lb 0.6 oz) 74.2 kg (163 lb 9.3 oz) 74.8 kg (164 lb 14.5 oz)    Examination: General appearance: Thin adult male, awake, lying in bed, listles, but no acute distress.   HEENT: Anicteric, conjunctiva pink, lids and lashes normal. No nasal deformity, discharge, epistaxis.  Lips dry, OP moist, no oral lesions, teeth normal.  Skin: Warm and dry.  No suspicious rashes or lesions. Cardiac: Tachycardic, regular, nl S1-S2, no murmurs appreciated.  Capillary refill is brisk.  JVP normal.  Arm edema bilaterally from IV infiltrate. No LE edema.  Radial pulses 2+ and symmetric. Respiratory: Normal respiratory rate and rhythm.  On nasal cannula, poor inspiratory effort, coarse rales at bases, no wheezing. Abdomen: Abdomen soft.  No TTP. No ascites, distension, hepatosplenomegaly.   MSK:  No deformities or effusions. Neuro: Awake but sleepy.Moves all extremities, globally weak. Speech fluent.    Psych: Sensorium intact and responding to questions, attention diminished. Affect blunted.  Judgment and insight appear poor.    Data Reviewed: I have personally reviewed following labs and imaging studies:  CBC: Recent Labs  Lab 09/12/17 0342 09/13/17 0606 09/14/17 0307 09/15/17 0332 09/16/17 0512  WBC 12.2* 20.9* 23.3* 19.4* 14.3*  NEUTROABS  --   --  17.5* 14.6* 10.3*  HGB 11.4* 13.1 11.8* 11.8* 11.6*  HCT 35.9* 41.1 37.0* 37.6* 37.2*  MCV 99.2 100.5* 100.5* 101.3* 102.5*  PLT 179 203 202 213 PLATELET CLUMPS NOTED ON SMEAR, UNABLE TO ESTIMATE   Basic Metabolic Panel: Recent Labs  Lab 09/12/17 0342 09/13/17 0606 09/14/17 0000 09/14/17 0307 09/14/17 1106 09/14/17 2328 09/15/17 0332 09/16/17 0031 09/16/17 0512  NA 144 142 143  --  144 142  --  144  --   K 4.7 4.5 5.0  --  4.2 4.5  --  3.8  --   CL 108 108 105  --  106 104  --  111  --   CO2 _0 --  29 28  --  26  --   GLUCOSE 123* 135* 134*  --  127* 150*  --  170*  --   BUN 28* 39* 50*  --  51* 44*  --  38*  --   CREATININE 1.76* 1.38* 1.77*  --  1.78* 1.47*  --  1.29*  --   CALCIUM 8.0* 9.1 9.0  --  8.6* 8.7*  --  8.9  --   MG 2.9* 3.1*  --  2.5*  --   --  2.6*  --  2.5*  PHOS 2.6 2.7  --  3.2  --   --  4.3  --  2.7   GFR: Estimated Creatinine Clearance: 78.3 mL/min (A) (by C-G formula based on SCr of 1.29 mg/dL (H)). Liver Function Tests: Recent Labs  Lab 09/14/17 0307 09/15/17 0332 09/16/17 0512  AST 117* 61* 70*  ALT 554* 385* 279*  ALKPHOS 45 47 40  BILITOT 1.3* 1.0 0.8  PROT 6.1* 6.2* 5.8*  ALBUMIN 2.3* 2.4* 2.4*   Recent Labs  Lab 09/14/17 1106  LIPASE 22  AMYLASE 40   No results for input(s): AMMONIA in the last 168 hours. Coagulation Profile: No results for input(s): INR, PROTIME in the last 168 hours. Cardiac Enzymes: Recent Labs  Lab 09/13/17 1000 09/15/17 0332  09/16/17 0512  CKTOTAL 6,633* 1,438* 1,389*  CKMB 21.0* 10.0* 14.4*   BNP (last 3 results) No results for input(s): PROBNP in the last 8760 hours. HbA1C: No results for input(s): HGBA1C in the last 72 hours. CBG: Recent Labs  Lab 09/16/17 1546 09/16/17 1948 09/16/17 2319 09/17/17 0357 09/17/17 0804  GLUCAP 86 202* 101* 104* 79   Lipid Profile: No results for input(s): CHOL, HDL, LDLCALC, TRIG, CHOLHDL, LDLDIRECT in the last 72 hours. Thyroid Function Tests: No results for input(s): TSH, T4TOTAL, FREET4, T3FREE, THYROIDAB in the last  72 hours. Anemia Panel: No results for input(s): VITAMINB12, FOLATE, FERRITIN, TIBC, IRON, RETICCTPCT in the last 72 hours. Urine analysis:    Component Value Date/Time   COLORURINE STRAW (A) 09/11/2017 1050   APPEARANCEUR CLEAR 09/11/2017 1050   LABSPEC 1.010 09/11/2017 1050   PHURINE 9.0 (H) 09/11/2017 1050   GLUCOSEU NEGATIVE 09/11/2017 1050   HGBUR MODERATE (A) 09/11/2017 [place] 09/11/2017 [place] 09/11/2017 1050   PROTEINUR 30 (A) 09/11/2017 1050   NITRITE NEGATIVE 09/11/2017 1050   LEUKOCYTESUR NEGATIVE 09/11/2017 1050   Sepsis Labs: _0 (procalcitonin:4,lacticacidven:4)  ) Recent Results (from the past 240 hour(s))  MRSA PCR Screening     Status: None   Collection Time: 09/09/17  8:31 PM  Result Value Ref Range Status   MRSA by PCR NEGATIVE NEGATIVE Final    Comment:        The GeneXpert MRSA Assay (FDA approved for NASAL specimens only), is one component of a comprehensive MRSA colonization surveillance program. It is not intended to diagnose MRSA infection nor to guide or monitor treatment for MRSA infections. Performed at [facility], [place] [address] [place], [place] [address]   Culture, bal-quantitative     Status: Abnormal   Collection Time: 09/11/17 10:56 AM  Result Value Ref Range Status   Specimen Description BRONCHIAL ALVEOLAR LAVAGE  Final    Special Requests NONE  Final   Gram Stain   Final    ABUNDANT WBC PRESENT, PREDOMINANTLY PMN FEW GRAM POSITIVE COCCI IN CLUSTERS    Culture (A)  Final    >=100,000 COLONIES/mL STAPHYLOCOCCUS AUREUS >=100,000 COLONIES/mL HAEMOPHILUS INFLUENZAE BETA LACTAMASE NEGATIVE Performed at [facility], [address], [place], [place] [address]    Report Status 09/15/2017 FINAL  Final   Organism ID, Bacteria STAPHYLOCOCCUS AUREUS (A)  Final      Susceptibility   Staphylococcus aureus - MIC*    CIPROFLOXACIN <=0.5 SENSITIVE Sensitive     ERYTHROMYCIN <=0.25 SENSITIVE Sensitive     GENTAMICIN <=0.5 SENSITIVE Sensitive     OXACILLIN 0.5 SENSITIVE Sensitive     TETRACYCLINE <=1 SENSITIVE Sensitive     VANCOMYCIN <=0.5 SENSITIVE Sensitive     TRIMETH/SULFA <=10 SENSITIVE Sensitive     CLINDAMYCIN <=0.25 SENSITIVE Sensitive     RIFAMPIN <=0.5 SENSITIVE Sensitive     Inducible Clindamycin NEGATIVE Sensitive     * >=100,000 COLONIES/mL STAPHYLOCOCCUS AUREUS  Urine Culture     Status: None   Collection Time: 09/11/17 11:14 AM  Result Value Ref Range Status   Specimen Description URINE, CATHETERIZED  Final   Special Requests NONE  Final   Culture   Final    NO GROWTH Performed at [facility], [place] [address] [place], [place] [address]    Report Status 09/12/2017 FINAL  Final  Culture, blood (Routine X 2) w Reflex to ID Panel     Status: None   Collection Time: 09/11/17 11:45 AM  Result Value Ref Range Status   Specimen Description BLOOD RIGHT HAND  Final   Special Requests   Final    BOTTLES DRAWN AEROBIC ONLY Blood Culture adequate volume   Culture   Final    NO GROWTH 5 DAYS Performed at [facility], [place] [address], [place],  [address]    Report Status 09/16/2017 FINAL  Final  Culture, blood (Routine X 2) w Reflex to ID Panel     Status: None   Collection  Time: 09/11/17 11:48 AM  Result Value Ref Range Status   Specimen Description BLOOD RIGHT HAND  Final    Special Requests   Final    BOTTLES DRAWN AEROBIC ONLY Blood Culture adequate volume   Culture   Final    NO GROWTH 5 DAYS Performed at [facility], [address], [place], [place] [address]    Report Status 09/16/2017 FINAL  Final         Radiology Studies: Dg Chest Port 1 View  Result Date: 09/16/2017 CLINICAL DATA:  Shortness of breath EXAM: PORTABLE CHEST 1 VIEW COMPARISON:  09/15/2017 FINDINGS: Cardiac shadow is stable. Endotracheal tube and nasogastric catheter are noted in satisfactory position. The lungs are well aerated bilaterally. Improved aeration left base is noted. No bony abnormality is seen. IMPRESSION: Tubes and lines as described above. No focal infiltrate is noted. Electronically Signed   By: Inez Catalina M.D.   On: 09/16/2017 07:09        Scheduled Meds: . dextromethorphan-guaiFENesin  1 tablet Oral BID  . heparin  5,000 Units Subcutaneous Q8H  . ipratropium  0.5 mg Nebulization QID  . levalbuterol  0.63 mg Nebulization QID  . methylPREDNISolone (SOLU-MEDROL) injection  60 mg Intravenous Q12H   Continuous Infusions: . sodium chloride Stopped (09/16/17 1500)  . sodium chloride Stopped (09/12/17 1330)  .  ceFAZolin (ANCEF) IV Stopped (09/17/17 0412)     LOS: 8 days    Time spent: [place], MD [facility] 09/17/2017, 11:17 AM     Pager [phone] --- please page though AMION:  [email or web address] If 7PM-7AM, please contact night-coverage

## 2017-09-17 NOTE — Evaluation (Signed)
Clinical/Bedside Swallow Evaluation Patient Details  Name: Edrees Valent MRN: [id number] Date of Birth: 11-11-1985  Today's Date: 09/17/2017 Time: SLP Start Time (ACUTE ONLY): 1450 SLP Stop Time (ACUTE ONLY): 1500 SLP Time Calculation (min) (ACUTE ONLY): 10 min  Past Medical History:  Past Medical History:  Diagnosis Date  . Asthma   . Asthma    Past Surgical History: History reviewed. No pertinent surgical history. HPI:  Mr. Pavek is a 32 y.o. M with asthma and polysubstance abuse with wheezing, shortness of breath, and chest tightness that had progressed over several days, in the context of smoking crack running out of his inhalers.  Respiratory failure in the ER was not responsive to albuterol, magnesium, Solu-Medrol, or epinephrine, and so he was intubated.  Chest x-ray initially clear. intubated 5/6-5/13. post tussive emesis.    Assessment / Plan / Recommendation Clinical Impression  Pt demosntrates normal swallow function, no signs of aspiration, despite being lethargic and having the hiccups. Pt has been vomiting occasionally, but tolerated 6 oz of juice and puree. Discussed basic precautions. No SLP f/u continue diet.  SLP Visit Diagnosis: Dysphagia, unspecified (R13.10)    Aspiration Risk  No limitations    Diet Recommendation Regular;Thin liquid   Liquid Administration via: Cup;Straw Medication Administration: Whole meds with liquid Supervision: Patient able to self feed    Other  Recommendations     Follow up Recommendations        Frequency and Duration            Prognosis        Swallow Study   General HPI: Mr. Klopf is a 32 y.o. M with asthma and polysubstance abuse with wheezing, shortness of breath, and chest tightness that had progressed over several days, in the context of smoking crack running out of his inhalers.  Respiratory failure in the ER was not responsive to albuterol, magnesium, Solu-Medrol, or epinephrine, and so he was intubated.  Chest x-ray  initially clear. intubated 5/6-5/13. post tussive emesis.  Type of Study: Bedside Swallow Evaluation Diet Prior to this Study: Regular;Thin liquids Temperature Spikes Noted: No Respiratory Status: Room air History of Recent Intubation: Yes Length of Intubations (days): 8 days Date extubated: 09/16/17 Behavior/Cognition: Lethargic/Drowsy Oral Cavity Assessment: Within Functional Limits Oral Care Completed by SLP: No Oral Cavity - Dentition: Adequate natural dentition Vision: Functional for self-feeding Self-Feeding Abilities: Able to feed self Patient Positioning: Upright in bed Baseline Vocal Quality: Normal Volitional Cough: Strong Volitional Swallow: Able to elicit    Oral/Motor/Sensory Function     Ice Chips     Thin Liquid Thin Liquid: Within functional limits Presentation: Cup;Straw    Nectar Thick Nectar Thick Liquid: Not tested   Honey Thick Honey Thick Liquid: Not tested   Puree Puree: Within functional limits   Solid   GO   Solid: Not tested        Claudine Mouton 09/17/2017,3:19 PM

## 2017-09-17 NOTE — Progress Notes (Addendum)
Patient became drowsy, diaphoretic, and tachypenic after  of Haldol. Breathing treatments were given. RN did a 12-lead EKG which showed normal sinus rhythm and QTc of 415. Patient denies of any chest pain. Lungs are clear toausculation. Elink MD was notified.

## 2017-09-18 ENCOUNTER — Inpatient Hospital Stay (HOSPITAL_COMMUNITY): Payer: Self-pay

## 2017-09-18 DIAGNOSIS — F10939 Alcohol use, unspecified with withdrawal, unspecified: Secondary | ICD-10-CM | POA: Diagnosis present

## 2017-09-18 DIAGNOSIS — R7401 Elevation of levels of liver transaminase levels: Secondary | ICD-10-CM | POA: Diagnosis present

## 2017-09-18 DIAGNOSIS — R740 Nonspecific elevation of levels of transaminase and lactic acid dehydrogenase [LDH]: Secondary | ICD-10-CM

## 2017-09-18 DIAGNOSIS — M6282 Rhabdomyolysis: Secondary | ICD-10-CM | POA: Diagnosis present

## 2017-09-18 DIAGNOSIS — E876 Hypokalemia: Secondary | ICD-10-CM

## 2017-09-18 DIAGNOSIS — J15211 Pneumonia due to Methicillin susceptible Staphylococcus aureus: Secondary | ICD-10-CM | POA: Diagnosis present

## 2017-09-18 DIAGNOSIS — G9341 Metabolic encephalopathy: Secondary | ICD-10-CM

## 2017-09-18 DIAGNOSIS — F10239 Alcohol dependence with withdrawal, unspecified: Secondary | ICD-10-CM | POA: Diagnosis present

## 2017-09-18 LAB — BASIC METABOLIC PANEL
Anion gap: 9 (ref 5–15)
BUN: 24 mg/dL — ABNORMAL HIGH (ref 6–20)
CO2: 25 mmol/L (ref 22–32)
CREATININE: 1.22 mg/dL (ref 0.61–1.24)
Calcium: 8.4 mg/dL — ABNORMAL LOW (ref 8.9–10.3)
Chloride: 107 mmol/L (ref 101–111)
GFR calc Af Amer: >60 mL/min (ref >60)
Glucose, Bld: 100 mg/dL — ABNORMAL HIGH (ref 65–99)
POTASSIUM: 3.3 mmol/L — AB (ref 3.5–5.1)
SODIUM: 141 mmol/L (ref 135–145)

## 2017-09-18 MED ORDER — ALBUTEROL SULFATE (2.5 MG/3ML) 0.083% IN NEBU: 2.5000 mg | INHALATION_SOLUTION | Freq: Four times a day (QID) | RESPIRATORY_TRACT | Status: DC

## 2017-09-18 MED ORDER — METHYLPREDNISOLONE SODIUM SUCC 125 MG IJ SOLR: 60.0000 mg | Freq: Every day | INTRAMUSCULAR | Status: DC

## 2017-09-18 MED ORDER — FAMOTIDINE IN NACL 20-0.9 MG/50ML-% IV SOLN
20.0000 mg | INTRAVENOUS | Status: DC
  Administered 2017-09-18: 20 mg via INTRAVENOUS
  Filled 2017-09-18: qty 50

## 2017-09-18 MED ORDER — ALBUTEROL SULFATE (2.5 MG/3ML) 0.083% IN NEBU
2.5000 mg | INHALATION_SOLUTION | Freq: Four times a day (QID) | RESPIRATORY_TRACT | Status: DC
  Administered 2017-09-18 (×2): 2.5 mg via RESPIRATORY_TRACT
  Filled 2017-09-18: qty 3

## 2017-09-18 MED ORDER — BOOST / RESOURCE BREEZE PO LIQD CUSTOM
1.0000 | Freq: Two times a day (BID) | ORAL | Status: DC
Start: 2017-09-18 — End: 2017-09-19
  Administered 2017-09-18: 1 via ORAL

## 2017-09-18 MED ORDER — ALBUTEROL SULFATE HFA 108 (90 BASE) MCG/ACT IN AERS
1.0000 | INHALATION_SPRAY | Freq: Four times a day (QID) | RESPIRATORY_TRACT | Status: DC
  Filled 2017-09-18: qty 6.7

## 2017-09-18 MED ORDER — ALBUTEROL SULFATE HFA 108 (90 BASE) MCG/ACT IN AERS: 1.0000 | INHALATION_SPRAY | RESPIRATORY_TRACT | Status: DC | PRN

## 2017-09-18 MED ORDER — POTASSIUM CHLORIDE IN NACL 20-0.9 MEQ/L-% IV SOLN
INTRAVENOUS | Status: DC
  Administered 2017-09-18: 1000 mL via INTRAVENOUS
  Filled 2017-09-18 (×3): qty 1000

## 2017-09-18 MED ORDER — ALBUTEROL SULFATE (2.5 MG/3ML) 0.083% IN NEBU: 2.5000 mg | INHALATION_SOLUTION | RESPIRATORY_TRACT | Status: DC | PRN

## 2017-09-18 MED ORDER — ALBUTEROL SULFATE (2.5 MG/3ML) 0.083% IN NEBU: 2.5000 mg | INHALATION_SOLUTION | Freq: Three times a day (TID) | RESPIRATORY_TRACT | Status: DC

## 2017-09-18 NOTE — Progress Notes (Signed)
Nutrition Follow-up  DOCUMENTATION CODES:   Not applicable  INTERVENTION:    Boost Breeze po BID, each supplement provides 250 kcal and 9 grams of protein  NUTRITION DIAGNOSIS:   Inadequate oral intake related to nausea, vomiting as evidenced by meal completion < 50%.  Ongoing  GOAL:   Patient will meet greater than or equal to 90% of their needs  Unmet  MONITOR:   PO intake, Supplement acceptance  ASSESSMENT:   32 yo male with PMH of asthma, marijuana use, and former smoker who was admitted on 5/6 with SOB requiring intubation in the ED.  Extubated 5/13. Diet has been advanced to regular diet by SLP.   Patient confused today. Spoke with nurse tech sitter at bedside who reports patient vomited this morning and is currently nauseous. He has had some juice and ice water since vomiting this morning. Only had bites on breakfast today.  Labs and medications reviewed.   Diet Order:   Diet Order           Diet regular Room service appropriate? Yes; Fluid consistency: Thin  Diet effective now          EDUCATION NEEDS:   No education needs have been identified at this time  Skin:  Skin Assessment: Reviewed RN Assessment  Last BM:  5/14  Height:   Ht Readings from Last 1 Encounters:  09/09/17 5' 7.01" (1.702 m)    Weight:   Wt Readings from Last 1 Encounters:  09/18/17 164 lb 3.9 oz (74.5 kg)    Ideal Body Weight:  67.3 kg  BMI:  Body mass index is 25.72 kg/m.  Estimated Nutritional Needs:   Kcal:  2100-2300  Protein:  100-110 gm  Fluid:  2.1-2.3 L    Joaquin Courts, RD, LDN, CNSC Pager [phone] After Hours Pager [phone]

## 2017-09-18 NOTE — Progress Notes (Signed)
PROGRESS NOTE    Jeremy Mccormick  QZE:[id number] DOB: 17-Nov-1985 DOA: 09/09/2017 PCP: Patient, No Pcp Per      Brief Narrative:  Jeremy Mccormick is a 32 y.o. M with asthma and polysubstance abuse with wheezing, shortness of breath, and chest tightness that had progressed over several days, in the context of smoking crack running out of his inhalers.  Respiratory failure in the ER was not responsive to albuterol, magnesium, Solu-Medrol, or epinephrine, and so he was intubated.  Chest x-ray initially clear.   Assessment & Plan:  Hypoxic and hypercapnic respiratory failure Severe persistent asthma with exacerbation -Continue Solu-Medrol, narrowed once daily -Continue famotidine -Continue scheduled and PRN bronchodilators  -Continue Robitussin as needed -Wean supplemental O2 to keep >92% but less than 96%  MSSA pneumonia Bronch on hospital day 3.  Cultures with Hflu and MSSA.  Chest x-ray personally reviewed, elevated right hemidiaphragm, right-sided effusion, right-sided airspace opacity. -Continue cefazolin day 4 of 7  Acute metabolic encephalopathy Alcohol withdrawal Cocaine withdrawal His metabolic encephalopathy is probably residual from sedatives in the ER, likely from some drug and alcohol withdrawal. CIWA range 13-27 in the last 24 hrs.    -IV haldol 2 mg for agitation p.r.n. -CIWA protocol with on demand lorazepam, taper tomorrow if scores <20  Rhabdomyolysis  Acute kidney injury Baseline Cr unknown, 1.0 at admission?  Peaked at 2, currently back to 1.2 mg/dL and stable today. -Continue IV Fluids -Trend CK  Hypokalemia -IVF with K  Post-tussive emesis SLP consulted, swallow is normal.  Nursing report no complaints of nausea, primarily post-tussive emesis.  This may continue to be a problem, especially as he is still delirious and withdrawing and mostly sedated.   Transaminitis Perhaps underlying liver disease.  If not, presume from rhabdo.  HIV engative. -Follow hep  serologies     DVT prophylaxis: Heparin Code Status: FULL Family Communication: None present MDM and disposition Plan: The below labs and imaging reports were reviewed and summarized above.  The patient status is clinically stable.  He was admitted with acute hypoxic and hypercapnic respiratory failure requiring intubation.  He was subsequently found to have MSSA pneumonia for which she is being treated with IV cefazolin.  He has improved from his respiratory standpoint and is now on nasal cannula alone, but his course has been complicated by agitation, severe acute metabolic encephalopathy, and agitation, threatening to leave AMA.    Consultants:   Critical care  Procedures:   Bronchoscopy 5/9 Airway entered and the following bronchi were examined: RUL, RML, RLL, LUL, LLL and Bronchi.   Bibasilar purulent secretion noted, no masses or foreign bodies Bronchoscope removed.       Antimicrobials:   Vancomycin 5/8 >> 5/11  Cefepime 5/8 >> 5/10  Linezolid 5/11 >> 5/12   Cefazolin 5/12 >>   Subjective: Sleepy, sluggish, recently given lorazepam.  Overnight, some agitation, but able to intermittently be awake, knows he is in hospital, confuses [name] (where he was first intubated), but oriented to situation (asthma flare, got intubated).  Cooperative at those times.  No nursing concerns for new pain, respiratory distress.  No new fever.  He has vomited frequently.         : Vitals:   09/18/17 0855 09/18/17 1000 09/18/17 1100 09/18/17 1132  BP: (!) 142/91     Pulse: (!) 51 64 (!) 58   Resp: (!) 28 (!) 31 (!) 35   Temp:    98.9 F (37.2 C)  TempSrc:  Oral  SpO2: 100% 97% 97%   Weight:      Height:        Intake/Output Summary (Last 24 hours) at 09/18/2017 1306 Last data filed at 09/18/2017 1228 Gross per 24 hour  Intake 2603.33 ml  Output 1250 ml  Net 1353.33 ml   Filed Weights   09/16/17 0417 09/17/17 0409 09/18/17 0336  Weight: 74.2 kg (163 lb  9.3 oz) 74.8 kg (164 lb 14.5 oz) 74.5 kg (164 lb 3.9 oz)    Examination: General appearance: Thin adult male, lying in bed, sleeping, no acute distress.     HEENT: No nasal deformity, discharge, epistaxis.  Lips dry, oropharynx moist, no oral lesions, teeth normal. Skin: Skin warm and dry, no suspicious rashes. Cardiac: Heart rate regular, normal S1-S2, no murmurs.  Arm edema is improved from yesterday but still present.  No lower extremity edema. Respiratory: Respirations appear easy and unlabored.  On nasal cannula.  Coarse rales at bilateral bases, diminished at both bases, wheezing appreciated. Abdomen: No ascites, distention, hepatosplenomegaly.  Abdomen soft, no grimace to palpation.   MSK: No deformities or effusions. Neuro: Unable to assess.    Psych: Unable to assess.    Data Reviewed: I have personally reviewed following labs and imaging studies:  CBC: Recent Labs  Lab 09/12/17 0342 09/13/17 0606 09/14/17 0307 09/15/17 0332 09/16/17 0512  WBC 12.2* 20.9* 23.3* 19.4* 14.3*  NEUTROABS  --   --  17.5* 14.6* 10.3*  HGB 11.4* 13.1 11.8* 11.8* 11.6*  HCT 35.9* 41.1 37.0* 37.6* 37.2*  MCV 99.2 100.5* 100.5* 101.3* 102.5*  PLT 179 203 202 213 PLATELET CLUMPS NOTED ON SMEAR, UNABLE TO ESTIMATE   Basic Metabolic Panel: Recent Labs  Lab 09/12/17 0342 09/13/17 0606 09/14/17 0000 09/14/17 0307 09/14/17 1106 09/14/17 2328 09/15/17 0332 09/16/17 0031 09/16/17 0512 09/18/17 0556  NA 144 142 143  --  144 142  --  144  --  141  K 4.7 4.5 5.0  --  4.2 4.5  --  3.8  --  3.3*  CL 108 108 105  --  106 104  --  111  --  107  CO2 _0 --  29 28  --  26  --  25  GLUCOSE 123* 135* 134*  --  127* 150*  --  170*  --  100*  BUN 28* 39* 50*  --  51* 44*  --  38*  --  24*  CREATININE 1.76* 1.38* 1.77*  --  1.78* 1.47*  --  1.29*  --  1.22  CALCIUM 8.0* 9.1 9.0  --  8.6* 8.7*  --  8.9  --  8.4*  MG 2.9* 3.1*  --  2.5*  --   --  2.6*  --  2.5*  --   PHOS 2.6 2.7  --  3.2  --   --   4.3  --  2.7  --    GFR: Estimated Creatinine Clearance: 82.8 mL/min (by C-G formula based on SCr of 1.22 mg/dL). Liver Function Tests: Recent Labs  Lab 09/14/17 0307 09/15/17 0332 09/16/17 0512  AST 117* 61* 70*  ALT 554* 385* 279*  ALKPHOS 45 47 40  BILITOT 1.3* 1.0 0.8  PROT 6.1* 6.2* 5.8*  ALBUMIN 2.3* 2.4* 2.4*   Recent Labs  Lab 09/14/17 1106  LIPASE 22  AMYLASE 40   No results for input(s): AMMONIA in the last 168 hours. Coagulation Profile: No results for input(s): INR, PROTIME in  the last 168 hours. Cardiac Enzymes: Recent Labs  Lab 09/13/17 1000 09/15/17 0332 09/16/17 0512  CKTOTAL 6,633* 1,438* 1,389*  CKMB 21.0* 10.0* 14.4*   BNP (last 3 results) No results for input(s): PROBNP in the last 8760 hours. HbA1C: No results for input(s): HGBA1C in the last 72 hours. CBG: Recent Labs  Lab 09/16/17 2319 09/17/17 0357 09/17/17 0804 09/17/17 1151 09/17/17 1546  GLUCAP 101* 104* 79 92 116*   Lipid Profile: No results for input(s): CHOL, HDL, LDLCALC, TRIG, CHOLHDL, LDLDIRECT in the last 72 hours. Thyroid Function Tests: No results for input(s): TSH, T4TOTAL, FREET4, T3FREE, THYROIDAB in the last 72 hours. Anemia Panel: No results for input(s): VITAMINB12, FOLATE, FERRITIN, TIBC, IRON, RETICCTPCT in the last 72 hours. Urine analysis:    Component Value Date/Time   COLORURINE STRAW (A) 09/11/2017 1050   APPEARANCEUR CLEAR 09/11/2017 1050   LABSPEC 1.010 09/11/2017 1050   PHURINE 9.0 (H) 09/11/2017 1050   GLUCOSEU NEGATIVE 09/11/2017 1050   HGBUR MODERATE (A) 09/11/2017 [place] 09/11/2017 [place] 09/11/2017 1050   PROTEINUR 30 (A) 09/11/2017 1050   NITRITE NEGATIVE 09/11/2017 1050   LEUKOCYTESUR NEGATIVE 09/11/2017 1050   Sepsis Labs: _0 (procalcitonin:4,lacticacidven:4)  ) Recent Results (from the past 240 hour(s))  MRSA PCR Screening     Status: None   Collection Time: 09/09/17  8:31 PM  Result  Value Ref Range Status   MRSA by PCR NEGATIVE NEGATIVE Final    Comment:        The GeneXpert MRSA Assay (FDA approved for NASAL specimens only), is one component of a comprehensive MRSA colonization surveillance program. It is not intended to diagnose MRSA infection nor to guide or monitor treatment for MRSA infections. Performed at [facility], [place] [address] [place], [place] [address]   Culture, bal-quantitative     Status: Abnormal   Collection Time: 09/11/17 10:56 AM  Result Value Ref Range Status   Specimen Description BRONCHIAL ALVEOLAR LAVAGE  Final   Special Requests NONE  Final   Gram Stain   Final    ABUNDANT WBC PRESENT, PREDOMINANTLY PMN FEW GRAM POSITIVE COCCI IN CLUSTERS    Culture (A)  Final    >=100,000 COLONIES/mL STAPHYLOCOCCUS AUREUS >=100,000 COLONIES/mL HAEMOPHILUS INFLUENZAE BETA LACTAMASE NEGATIVE Performed at [facility], [address], [place], [place] [address]    Report Status 09/15/2017 FINAL  Final   Organism ID, Bacteria STAPHYLOCOCCUS AUREUS (A)  Final      Susceptibility   Staphylococcus aureus - MIC*    CIPROFLOXACIN <=0.5 SENSITIVE Sensitive     ERYTHROMYCIN <=0.25 SENSITIVE Sensitive     GENTAMICIN <=0.5 SENSITIVE Sensitive     OXACILLIN 0.5 SENSITIVE Sensitive     TETRACYCLINE <=1 SENSITIVE Sensitive     VANCOMYCIN <=0.5 SENSITIVE Sensitive     TRIMETH/SULFA <=10 SENSITIVE Sensitive     CLINDAMYCIN <=0.25 SENSITIVE Sensitive     RIFAMPIN <=0.5 SENSITIVE Sensitive     Inducible Clindamycin NEGATIVE Sensitive     * >=100,000 COLONIES/mL STAPHYLOCOCCUS AUREUS  Urine Culture     Status: None   Collection Time: 09/11/17 11:14 AM  Result Value Ref Range Status   Specimen Description URINE, CATHETERIZED  Final   Special Requests NONE  Final   Culture   Final    NO GROWTH Performed at [facility], [place] [address] [place], [place] [address]    Report Status 09/12/2017 FINAL  Final  Culture, blood  (  Routine X 2) w Reflex to ID Panel     Status: None   Collection Time: 09/11/17 11:45 AM  Result Value Ref Range Status   Specimen Description BLOOD RIGHT HAND  Final   Special Requests   Final    BOTTLES DRAWN AEROBIC ONLY Blood Culture adequate volume   Culture   Final    NO GROWTH 5 DAYS Performed at [facility], [address], [place], [place] [address]    Report Status 09/16/2017 FINAL  Final  Culture, blood (Routine X 2) w Reflex to ID Panel     Status: None   Collection Time: 09/11/17 11:48 AM  Result Value Ref Range Status   Specimen Description BLOOD RIGHT HAND  Final   Special Requests   Final    BOTTLES DRAWN AEROBIC ONLY Blood Culture adequate volume   Culture   Final    NO GROWTH 5 DAYS Performed at [facility], [place] [address], [place], [place] [address]    Report Status 09/16/2017 FINAL  Final         Radiology Studies: Dg Chest 2 View  Result Date: 09/18/2017 CLINICAL DATA:  Cough and shortness of breath EXAM: CHEST - 2 VIEW COMPARISON:  Sep 16, 2017 FINDINGS: There is airspace consolidation in the posterior aspect of the right lower lobe with small right pleural effusion. Lungs elsewhere clear. Heart size and pulmonary vascularity are normal. No adenopathy. No bone lesions. IMPRESSION: Airspace consolidation consistent with pneumonia posterior right base with small right pleural effusion. Lungs elsewhere clear. Stable cardiac silhouette. Electronically Signed   By: Lowella Grip III M.D.   On: 09/18/2017 10:12        Scheduled Meds: . albuterol  2.5 mg Nebulization Q6H  . dextromethorphan-guaiFENesin  1 tablet Oral BID  . feeding supplement  1 Container Oral BID BM  . heparin  5,000 Units Subcutaneous Q8H  . methylPREDNISolone (SOLU-MEDROL) injection  60 mg Intravenous Q12H   Continuous Infusions: . 0.9 % NaCl with KCl 20 mEq / L 1,000 mL (09/18/17 1140)  .  ceFAZolin (ANCEF) IV Stopped (09/18/17 1139)     LOS: 9 days    Time  spent: 25 minutes1   Edwin Dada, MD [facility] 09/18/2017, 8:30 AM     Pager [phone] --- please page though AMION:  [email or web address] Password [email or web address] If 7PM-7AM, please contact night-coverage

## 2017-09-18 NOTE — Procedures (Signed)
Arterial Catheter Insertion Procedure Note Jeremy Mccormick [id number] 1985/08/06  Procedure: Insertion of Arterial Catheter  Indications: Blood pressure monitoring and Frequent blood sampling  Procedure Details Consent: Unable to obtain consent because of altered level of consciousness. Time Out: Verified patient identification, verified procedure, site/side was marked, verified correct patient position, special equipment/implants available, medications/allergies/relevent history reviewed, required imaging and test results available.  Performed  Maximum sterile technique was used including antiseptics, cap, gloves, gown, hand hygiene, mask and sheet. Skin prep: Chlorhexidine; local anesthetic administered 20 gauge catheter was inserted into left radial artery using the Seldinger technique. ULTRASOUND GUIDANCE USED: NO Evaluation Blood flow good; BP tracing good. Complications: No apparent complications.    [name] 09/18/2017

## 2017-09-18 NOTE — Progress Notes (Signed)
Patient became combative, agitated and was trying to get out of bed despite giving him Haldol due to him insisting that he needed his inhaler instead of a breathing treatment. Charge was called to bedside who also contacted security.He eventfully agreed for a breathing treatment. Patient is currently calm with no signs of respiratory distress.

## 2017-09-18 NOTE — Progress Notes (Signed)
Lg emesis approx 200 cc bile to yellow colored - oral care done post done Pt alert oriented only to self and place - given Zofran - see MAR

## 2017-09-18 NOTE — Progress Notes (Signed)
Pharmacy Antibiotic Note  Jeremy Mccormick is a 32 y.o. male admitted on 09/09/2017 with asthma exacerbation. On Cefazolin day # 4 of 7 for MSSA growing in BAL. SCr has improved to 1.22 with estimated CrCl ~ 80-85 mL/min.. WBC is trending down and pt is afebrile.  Plan: Continue Cefazolin 2gm IV Q8H F/u renal fxn, C&S, clinical status    Height: 5' 7.01" (170.2 cm) Weight: 164 lb 3.9 oz (74.5 kg) IBW/kg (Calculated) : 66.12  Temp (24hrs), Avg:99.3 F (37.4 C), Min:98.6 F (37 C), Max:101.4 F (38.6 C)  Recent Labs  Lab 09/12/17 0342 09/13/17 0606 09/14/17 0000 09/14/17 0307 09/14/17 1106 09/14/17 2328 09/15/17 0332 09/16/17 0031 09/16/17 0512 09/18/17 0556  WBC 12.2* 20.9*  --  23.3*  --   --  19.4*  --  14.3*  --   CREATININE 1.76* 1.38* 1.77*  --  1.78* 1.47*  --  1.29*  --  1.22  LATICACIDVEN  --   --  2.1*  --   --  1.7  --   --   --   --      Antimicrobials this admission: Cefazolin 5/12>> Linzolid 5/11>>5/12 5/8 vancomycin >5/11 5/8 cefepime >5/10  Microbiology results: 5/6 mrsa: neg 5/8 blood cx: NGTD 5/8 BAL - MSSA 5/8 Urine - NEG  Sloan Leiter, PharmD, BCPS, BCCCP Clinical Pharmacist Clinical phone 09/18/2017 until 3:30PM- #[phone] After hours, please call #[phone] 09/18/2017 1:33 PM

## 2017-09-19 ENCOUNTER — Encounter: Payer: Self-pay | Admitting: Family Medicine

## 2017-09-19 LAB — BASIC METABOLIC PANEL
ANION GAP: 7 (ref 5–15)
BUN: 18 mg/dL (ref 6–20)
CHLORIDE: 105 mmol/L (ref 101–111)
CO2: 26 mmol/L (ref 22–32)
Calcium: 7.9 mg/dL — ABNORMAL LOW (ref 8.9–10.3)
Creatinine, Ser: 1.15 mg/dL (ref 0.61–1.24)
GFR calc non Af Amer: >60 mL/min (ref >60)
Glucose, Bld: 96 mg/dL (ref 65–99)
POTASSIUM: 3.4 mmol/L — AB (ref 3.5–5.1)
Sodium: 138 mmol/L (ref 135–145)

## 2017-09-19 LAB — HEPATITIS PANEL, ACUTE
HCV Ab: 0.6 s/co ratio (ref 0.0–0.9)
HEP B S AG: NEGATIVE
Hep A IgM: NEGATIVE
Hep B C IgM: NEGATIVE

## 2017-09-19 LAB — CK: CK TOTAL: 967 U/L — AB (ref 49–397)

## 2017-09-19 NOTE — Progress Notes (Signed)
Pt woke up around 0300 after having an incontinent stool and RN had a lengthy conversation about why he is still in the hospital. RN reminded pt of the circumstances that brought him into the hospital requiring intubation and that he is only 32 years old. At that time patient agreed to work with staff and that hopefully he would be able to go home today.  Around 0530, this RN was notified that pt is adamant about leaving and wanting to leave AMA. Pt was unable to be talked down and by this RN and multiple staff on the unit including the charge nurse. On-call MD was paged multiple times to notify of patients request to leave. Pt showed that he was able to walk down the hall however his gait is very unsteady, pt was also reminded that he was incontinent of stool during the shift. Pt brushed it off saying "that aint nothing, I can clean that up." Pt stated "I'm not incompetent." RN again tried talking to pt to remind him that he is not 100% back to his baseline and we want to see him do well and be successful when he leaves the hospital and not have to return because of complications due to him leaving the hospital before he was fully ready. Pt continued to state "I'm getting out of here, they told me I was leaving today, so I'm leaving. I don't know why they want me to wait 3 hours when I'm supposed to leave today anyway." Pt was reminded that he is progressing towards discharge but still needs some assistance with ADLs and mobility.  Pt signed AMA paperwork, RN removed 2 PIVs from his right forearm. Pts girlfriend brought a wheelchair to the unit and wheeled patient along with his belongings off the unit. Caswell Corwin, RN 09/19/17 6:05 AM

## 2017-09-19 NOTE — Discharge Summary (Signed)
[facility] Discharge Summary   Patient: Jeremy Mccormick ZOX:[id number]   PCP: Patient, No Pcp Per DOB: 1985-09-08   Date of admission: 09/09/2017   Date of discharge: 09/19/2017    Discharge Diagnoses:  Principal Problem:   Acute respiratory failure with hypoxia ([place]) Active Problems:   Asthma exacerbation   Transaminitis   MSSA (methicillin susceptible Staphylococcus aureus) pneumonia (HCC)   Alcohol withdrawal syndrome with complication, with unspecified complication ([place])   Rhabdomyolysis   Discharge Condition: Unknown  History of present illness: As per the H and P dictated on admission, "32 year old gentleman with a history of asthma.  He is admitted with a severe acute exacerbation that appeared to be triggered by a viral upper respiratory infection.  He lives with his girlfriend and she had the illness also.  He presented with wheeze, dyspnea, cough with clear sputum.  He rapidly progressed to acute respiratory failure and required mechanical ventilation.  Chest x-ray is hyperinflated, some possible right basilar atelectasis but no convincing infiltrate."  Hospital Course:  The patient quickly required intubation in the ER on 5/6, started on IV steroids and broad spectrum antibiotics.   -He underwent bronchoscopy on 5/9 that showed MSSA pneumonia and Hflu. -Was transitioned to cefazolin, had completed 4 days by the time he left the hospital. -Also suffered rhabdomyolysis, and associated kidney injury both of which were resolving with fluids at the time he left the hospital -Transaminitis noted, from rhabdo.  HIV and Hepatitis serogies negative. -Patient had waxing and waning agitation and confusion, was being treated with lorazepam and Haldol for suspected alcohol and cocaine withdrawal delirium.   On 5/16 at 5AM, the patient insisted to leave.  I was not notified as I was off service.  Before the covering provider could arrive, the patient left against medical advice.  It is  not clear that he was decisional at that time, but again, he left before a provider could evaluate him.  Today, I spoke with the patient by phone, after calling his cell #.  He was oriented, sounded in no distress.  Was with his partner. I explained that he had not completed treatment, had not been cleared for discharge.  He requires probably 3 to 6 more days antibiotics to complete a 7-10 day course or his MSSA pneumonia.  He also requires a prednisone taper and refills of his inhalers.  I recommended he return to the ER this morning for evaluation and he and partner agreed.      Summary of his active problems in the hospital is as following. Principal Problem:   Acute respiratory failure with hypoxia (HCC) Active Problems:   Asthma exacerbation   Transaminitis   MSSA (methicillin susceptible Staphylococcus aureus) pneumonia (HCC)   Alcohol withdrawal syndrome with complication, with unspecified complication ([place])   Rhabdomyolysis    patient left AMA  Procedures and Results:  Bronchoscopy 5/9  Airway entered and the following bronchi were examined: RUL, RML, RLL, LUL, LLL and Bronchi.   Bibasilar purulent secretion noted, no masses or foreign bodies Bronchoscope removed.       Consultations:  Critical care  The results of significant diagnostics from this hospitalization (including imaging, microbiology, ancillary and laboratory) are listed below for reference.    Significant Diagnostic Studies: Dg Chest 2 View  Result Date: 09/18/2017 CLINICAL DATA:  Cough and shortness of breath EXAM: CHEST - 2 VIEW COMPARISON:  Sep 16, 2017 FINDINGS: There is airspace consolidation in the posterior aspect of the right lower lobe with  small right pleural effusion. Lungs elsewhere clear. Heart size and pulmonary vascularity are normal. No adenopathy. No bone lesions. IMPRESSION: Airspace consolidation consistent with pneumonia posterior right base with small right pleural effusion. Lungs  elsewhere clear. Stable cardiac silhouette. Electronically Signed   By: Lowella Grip III M.D.   On: 09/18/2017 10:12   Dg Chest Port 1 View  Result Date: 09/16/2017 CLINICAL DATA:  Shortness of breath EXAM: PORTABLE CHEST 1 VIEW COMPARISON:  09/15/2017 FINDINGS: Cardiac shadow is stable. Endotracheal tube and nasogastric catheter are noted in satisfactory position. The lungs are well aerated bilaterally. Improved aeration left base is noted. No bony abnormality is seen. IMPRESSION: Tubes and lines as described above. No focal infiltrate is noted. Electronically Signed   By: Inez Catalina M.D.   On: 09/16/2017 07:09   Dg Chest Port 1 View  Result Date: 09/15/2017 CLINICAL DATA:  Endotracheal tube placement.  Ventilator support. EXAM: PORTABLE CHEST 1 VIEW COMPARISON:  09/14/2017 FINDINGS: Endotracheal tube is 5 cm above the carina. Nasogastric tube enters the abdomen. Heart size is normal. The lungs are clear except for minimal residual infiltrate in the left lower lobe. No worsening or new finding. No bone abnormality. IMPRESSION: Endotracheal tube and nasogastric tube well position. Mild left lower lobe pneumonia remains visible. No new or progressive finding. Electronically Signed   By: Nelson Chimes M.D.   On: 09/15/2017 07:50   Dg Chest Port 1 View  Result Date: 09/14/2017 CLINICAL DATA:  Intubation EXAM: PORTABLE CHEST 1 VIEW COMPARISON:  Portable exam 0959 hours compared to 09/13/2017 FINDINGS: Mildly rotated to the RIGHT. Tip of endotracheal tube projects 3.5 cm above carina. Nasogastric tube extends into stomach. Normal heart size, mediastinal contours and pulmonary vascularity. Central peribronchial thickening. Focal area of infiltrate at LEFT base. Remaining lungs clear. No pleural effusion or pneumothorax. Soft tissue gas identified at the RIGHT supraclavicular region, unchanged. No acute osseous findings. IMPRESSION: Bronchitic changes with focus of infiltrate at LEFT lung base question  pneumonia. Foci of soft tissue gas at the RIGHT supraclavicular region, unchanged since 09/12/2017. Electronically Signed   By: Lavonia Dana M.D.   On: 09/14/2017 10:35   Dg Chest Port 1 View  Result Date: 09/14/2017 CLINICAL DATA:  32 year old male status post intubation. EXAM: PORTABLE CHEST 1 VIEW COMPARISON:  Chest radiograph dated 09/13/2017 FINDINGS: Endotracheal tube above the carina in similar position as before and enteric tube extending to the left hemiabdomen with tip beyond the inferior margin of the image. Minimal bibasilar atelectatic changes. No focal consolidation. There is no pleural effusion or pneumothorax. The cardiac silhouette is within normal limits. No acute osseous pathology. IMPRESSION: No interval change. Electronically Signed   By: Anner Crete M.D.   On: 09/14/2017 00:25   Dg Chest Port 1 View  Result Date: 09/13/2017 CLINICAL DATA:  Endotracheal tube EXAM: PORTABLE CHEST 1 VIEW COMPARISON:  Yesterday FINDINGS: Endotracheal tube tip between the clavicular heads and carina. An orogastric tube reaches the stomach. Artifact from EKG leads. Mild haziness of the lower chest. No air bronchogram, edema, effusion, or pneumothorax. There is soft tissue emphysema at the right supraclavicular fossa that was also seen yesterday. Normal heart size. IMPRESSION: 1. Stable positioning of support tubes. 2. Mild haziness of the lower chest, likely mild atelectasis. 3. Stable right supraclavicular soft tissue emphysema. Electronically Signed   By: Monte Fantasia M.D.   On: 09/13/2017 11:29   Dg Chest Port 1 View  Result Date: 09/12/2017 CLINICAL DATA:  Acute respiratory  failure with hypoxia, asthma exacerbation, intubated EXAM: PORTABLE CHEST 1 VIEW COMPARISON:  09/12/2017 FINDINGS: Endotracheal tube 3.1 cm above the carina. NG tube within the stomach. Normal heart size and vascularity. Minor basilar atelectasis. No focal pneumonia, collapse or consolidation. Negative for edema, effusion or  significant pneumothorax. Right neck subcutaneous emphysema noted. IMPRESSION: Stable support apparatus. Lower lung volumes with basilar atelectasis Right neck subcutaneous emphysema. Electronically Signed   By: Jerilynn Mages.  Shick M.D.   On: 09/12/2017 07:58   Dg Chest Port 1 View  Result Date: 09/10/2017 CLINICAL DATA:  Respiratory distress. Acute respiratory failure. History of asthma, former smoker. EXAM: PORTABLE CHEST 1 VIEW COMPARISON:  Portable chest x-ray of Sep 10, 2017 at 1:28 a.m. FINDINGS: The lungs remain hyperinflated and clear. There is no pneumothorax or pleural effusion. The endotracheal tube tip projects approximately 3.1 cm above the carina. The esophagogastric tube tip and proximal port project below the inferior margin of the image. The heart and pulmonary vascularity are normal. The mediastinum is normal in width. IMPRESSION: Persistent hyperinflation. No evidence of barotrauma. No acute pneumonia. The support tubes are in reasonable position. Electronically Signed   By: David  Martinique M.D.   On: 09/10/2017 10:18   Dg Chest Port 1 View  Result Date: 09/10/2017 CLINICAL DATA:  32 y/o  M; status asthmaticus. EXAM: PORTABLE CHEST 1 VIEW COMPARISON:  09/09/2017 chest radiograph FINDINGS: Stable endotracheal tube and enteric tube. Clear lungs. No pneumothorax or pleural effusion. Stable normal cardiac silhouette. Bones are unremarkable. IMPRESSION: Stable endotracheal and enteric tubes. No acute pulmonary process identified. Electronically Signed   By: Kristine Garbe M.D.   On: 09/10/2017 00:48   Dg Chest Portable 1 View  Result Date: 09/09/2017 CLINICAL DATA:  Endotracheal and enteric tube placement. EXAM: PORTABLE CHEST 1 VIEW COMPARISON:  Chest x-ray dated February 01, 2017. FINDINGS: Endotracheal tube in place with the tip approximately 4.5 cm above the level of the carina. Enteric tube seen entering the stomach with the tip below the field of view. The heart size and mediastinal  contours are within normal limits. Normal pulmonary vascularity. No focal consolidation, pleural effusion, or pneumothorax. No acute osseous abnormality. IMPRESSION: 1. Appropriately positioned endotracheal tube. Enteric tube entering the stomach with the tip below the field of view. 2.  No active cardiopulmonary disease. Electronically Signed   By: Titus Dubin M.D.   On: 09/09/2017 14:38   Dg Abd Portable 1v  Result Date: 09/14/2017 CLINICAL DATA:  Vomiting EXAM: PORTABLE ABDOMEN - 1 VIEW COMPARISON:  Portable exam 1015 hours without priors for comparison FINDINGS: Tip of nasogastric tube projects over mid stomach. Nonobstructive bowel gas pattern. No bowel dilatation or bowel wall thickening. Osseous structures unremarkable. Lung bases clear. No urinary tract calcification. IMPRESSION: Normal exam. Electronically Signed   By: Lavonia Dana M.D.   On: 09/14/2017 10:32    Microbiology: Recent Results (from the past 240 hour(s))  MRSA PCR Screening     Status: None   Collection Time: 09/09/17  8:31 PM  Result Value Ref Range Status   MRSA by PCR NEGATIVE NEGATIVE Final    Comment:        The GeneXpert MRSA Assay (FDA approved for NASAL specimens only), is one component of a comprehensive MRSA colonization surveillance program. It is not intended to diagnose MRSA infection nor to guide or monitor treatment for MRSA infections. Performed at [facility], [place] [address] [place], [place] [address]   Culture, bal-quantitative     Status:  Abnormal   Collection Time: 09/11/17 10:56 AM  Result Value Ref Range Status   Specimen Description BRONCHIAL ALVEOLAR LAVAGE  Final   Special Requests NONE  Final   Gram Stain   Final    ABUNDANT WBC PRESENT, PREDOMINANTLY PMN FEW GRAM POSITIVE COCCI IN CLUSTERS    Culture (A)  Final    >=100,000 COLONIES/mL STAPHYLOCOCCUS AUREUS >=100,000 COLONIES/mL HAEMOPHILUS INFLUENZAE BETA LACTAMASE NEGATIVE Performed at [facility], [address] [place], [place] [address]    Report Status 09/15/2017 FINAL  Final   Organism ID, Bacteria STAPHYLOCOCCUS AUREUS (A)  Final      Susceptibility   Staphylococcus aureus - MIC*    CIPROFLOXACIN <=0.5 SENSITIVE Sensitive     ERYTHROMYCIN <=0.25 SENSITIVE Sensitive     GENTAMICIN <=0.5 SENSITIVE Sensitive     OXACILLIN 0.5 SENSITIVE Sensitive     TETRACYCLINE <=1 SENSITIVE Sensitive     VANCOMYCIN <=0.5 SENSITIVE Sensitive     TRIMETH/SULFA <=10 SENSITIVE Sensitive     CLINDAMYCIN <=0.25 SENSITIVE Sensitive     RIFAMPIN <=0.5 SENSITIVE Sensitive     Inducible Clindamycin NEGATIVE Sensitive     * >=100,000 COLONIES/mL STAPHYLOCOCCUS AUREUS  Urine Culture     Status: None   Collection Time: 09/11/17 11:14 AM  Result Value Ref Range Status   Specimen Description URINE, CATHETERIZED  Final   Special Requests NONE  Final   Culture   Final    NO GROWTH Performed at [facility], [place] [address], [place], [place] [address]    Report Status 09/12/2017 FINAL  Final  Culture, blood (Routine X 2) w Reflex to ID Panel     Status: None   Collection Time: 09/11/17 11:45 AM  Result Value Ref Range Status   Specimen Description BLOOD RIGHT HAND  Final   Special Requests   Final    BOTTLES DRAWN AEROBIC ONLY Blood Culture adequate volume   Culture   Final    NO GROWTH 5 DAYS Performed at [facility], [place] [address], [place], [place] [address]    Report Status 09/16/2017 FINAL  Final  Culture, blood (Routine X 2) w Reflex to ID Panel     Status: None   Collection Time: 09/11/17 11:48 AM  Result Value Ref Range Status   Specimen Description BLOOD RIGHT HAND  Final   Special Requests   Final    BOTTLES DRAWN AEROBIC ONLY Blood Culture adequate volume   Culture   Final    NO GROWTH 5 DAYS Performed at [facility], [place] [address] [place], [place] [address]    Report Status 09/16/2017 FINAL  Final  Culture, blood (routine x 2)     Status: None  (Preliminary result)   Collection Time: 09/17/17 10:02 PM  Result Value Ref Range Status   Specimen Description BLOOD LEFT ARM  Final   Special Requests   Final    BOTTLES DRAWN AEROBIC AND ANAEROBIC Blood Culture results may not be optimal due to an excessive volume of blood received in culture bottles   Culture   Final    NO GROWTH 2 DAYS Performed at [facility], [place] [address], [place], [place] [address]    Report Status PENDING  Incomplete  Culture, blood (routine x 2)     Status: None (Preliminary result)   Collection Time: 09/17/17 10:13 PM  Result Value Ref Range Status   Specimen Description BLOOD RIGHT HAND  Final  Special Requests   Final    BOTTLES DRAWN AEROBIC ONLY Blood Culture results may not be optimal due to an inadequate volume of blood received in culture bottles   Culture   Final    NO GROWTH 2 DAYS Performed at [facility], [place] [address] [place], [place] [address]    Report Status PENDING  Incomplete     Labs: CBC: Recent Labs  Lab 09/13/17 0606 09/14/17 0307 09/15/17 0332 09/16/17 0512  WBC 20.9* 23.3* 19.4* 14.3*  NEUTROABS  --  17.5* 14.6* 10.3*  HGB 13.1 11.8* 11.8* 11.6*  HCT 41.1 37.0* 37.6* 37.2*  MCV 100.5* 100.5* 101.3* 102.5*  PLT 203 202 213 PLATELET CLUMPS NOTED ON SMEAR, UNABLE TO ESTIMATE   Basic Metabolic Panel: Recent Labs  Lab 09/13/17 0606  09/14/17 0307 09/14/17 1106 09/14/17 2328 09/15/17 0332 09/16/17 0031 09/16/17 0512 09/18/17 0556 09/19/17 0314  NA 142   < >  --  144 142  --  144  --  141 138  K 4.5   < >  --  4.2 4.5  --  3.8  --  3.3* 3.4*  CL 108   < >  --  106 104  --  111  --  107 105  CO2 25   < >  --  29 28  --  26  --  25 26  GLUCOSE 135*   < >  --  127* 150*  --  170*  --  100* 96  BUN 39*   < >  --  51* 44*  --  38*  --  24* 18  CREATININE 1.38*   < >  --  1.78* 1.47*  --  1.29*  --  1.22 1.15  CALCIUM 9.1   < >  --  8.6* 8.7*  --  8.9  --  8.4* 7.9*  MG 3.1*  --  2.5*  --   --  2.6*   --  2.5*  --   --   PHOS 2.7  --  3.2  --   --  4.3  --  2.7  --   --    < > = values in this interval not displayed.   Liver Function Tests: Recent Labs  Lab 09/14/17 0307 09/15/17 0332 09/16/17 0512  AST 117* 61* 70*  ALT 554* 385* 279*  ALKPHOS 45 47 40  BILITOT 1.3* 1.0 0.8  PROT 6.1* 6.2* 5.8*  ALBUMIN 2.3* 2.4* 2.4*   Recent Labs  Lab 09/14/17 1106  LIPASE 22  AMYLASE 40   No results for input(s): AMMONIA in the last 168 hours. Cardiac Enzymes: Recent Labs  Lab 09/13/17 1000 09/15/17 0332 09/16/17 0512 09/19/17 0314  CKTOTAL 6,633* 1,438* 1,389* 967*  CKMB 21.0* 10.0* 14.4*  --    BNP (last 3 results) No results for input(s): BNP in the last 8760 hours. CBG: Recent Labs  Lab 09/16/17 2319 09/17/17 0357 09/17/17 0804 09/17/17 1151 09/17/17 1546  GLUCAP 101* 104* 79 92 116*   Time spent: 30 minutes  Signed:  Suann Larry Danford  [facility] 09/19/2017, 12:21 PM

## 2017-09-19 NOTE — Progress Notes (Signed)
Report given to 2w RN .  Patient awake and compliant at this time.  Vss.  Patient apologized for his behavior over the last few days.  Transfer to 2w without incident

## 2017-09-20 ENCOUNTER — Emergency Department (HOSPITAL_COMMUNITY): Admission: EM | Admit: 2017-09-20 | Discharge: 2017-09-20 | Discharge status: Against Medical Advice | Payer: Self-pay

## 2017-09-20 ENCOUNTER — Emergency Department (HOSPITAL_COMMUNITY)
Admission: EM | Admit: 2017-09-20 | Discharge: 2017-09-20 | Disposition: A | Discharge status: Home / Self Care | Payer: Self-pay

## 2017-09-20 NOTE — ED Notes (Signed)
Pt and family member notified staff of intent to leave and been seen at another facility.

## 2017-09-22 LAB — CULTURE, BLOOD (ROUTINE X 2)
Culture: NO GROWTH
Culture: NO GROWTH

## 2017-10-11 ENCOUNTER — Ambulatory Visit: Payer: Self-pay | Attending: Internal Medicine | Admitting: Internal Medicine

## 2017-10-11 ENCOUNTER — Ambulatory Visit: Payer: Self-pay

## 2017-10-11 ENCOUNTER — Encounter: Payer: Self-pay | Admitting: Internal Medicine

## 2017-10-11 VITALS — BP 150/93 | HR 67 | Temp 98.7°F | Resp 16 | Wt 144.6 lb

## 2017-10-11 DIAGNOSIS — F1211 Cannabis abuse, in remission: Secondary | ICD-10-CM | POA: Insufficient documentation

## 2017-10-11 DIAGNOSIS — J4520 Mild intermittent asthma, uncomplicated: Secondary | ICD-10-CM | POA: Insufficient documentation

## 2017-10-11 DIAGNOSIS — Z87891 Personal history of nicotine dependence: Secondary | ICD-10-CM | POA: Insufficient documentation

## 2017-10-11 DIAGNOSIS — Z87898 Personal history of other specified conditions: Secondary | ICD-10-CM

## 2017-10-11 DIAGNOSIS — Z8614 Personal history of Methicillin resistant Staphylococcus aureus infection: Secondary | ICD-10-CM | POA: Insufficient documentation

## 2017-10-11 DIAGNOSIS — R030 Elevated blood-pressure reading, without diagnosis of hypertension: Secondary | ICD-10-CM | POA: Insufficient documentation

## 2017-10-11 DIAGNOSIS — F1411 Cocaine abuse, in remission: Secondary | ICD-10-CM | POA: Insufficient documentation

## 2017-10-11 MED ORDER — ALBUTEROL SULFATE HFA 108 (90 BASE) MCG/ACT IN AERS: 1.0000 | INHALATION_SPRAY | Freq: Four times a day (QID) | RESPIRATORY_TRACT | 6 refills | Status: AC | PRN

## 2017-10-11 NOTE — Progress Notes (Signed)
Pt states his right hand is tender to touch

## 2017-10-11 NOTE — Progress Notes (Signed)
Patient ID: Jeremy Mccormick, male    DOB: 22-Apr-1986  MRN: [id number]  CC: Hospitalization Follow-up   Subjective: Jeremy Mccormick is a 32 y.o. male who presents for new pt visit and hosp f/u.  His girlfriend is with him His concerns today include:  Pt with hx of asthma, subst use disorder (marijuana, cocaine)  Patient hospitalized 5/6-16/201 9 with acute respiratory failure due to asthma exacerbation and requiring intubation.  He was found to have MSSA pneumonia.  Course was complicated by rhabdomyolysis and alcohol withdrawal.  Urine drug screen was positive for marijuana, cocaine and benzos.  HIV and hepatitis serologies were negative.  Once extubated patient ended up leaving AGAINST MEDICAL ADVICE and did not receive abx to continue taking upon dischg.    Since discharge he reports that his breathing has been back to baseline.  He denies any shortness of breath, wheezing or cough.  No fevers.  He uses albuterol occasionally does not have to use it every day.  He also quit smoking.  He also reports that he discontinued using all street drugs except used marijuana about 2 weeks ago.  He has returned to work. Blood pressure noted to be elevated today.  He reports being diagnosed with hypertension about 4 years ago and was on medication which he took for a limited time.     Patient Active Problem List   Diagnosis Date Noted  . Transaminitis 09/18/2017  . MSSA (methicillin susceptible Staphylococcus aureus) pneumonia (HCC) 09/18/2017  . Alcohol withdrawal syndrome with complication, with unspecified complication (HCC) 09/18/2017  . Rhabdomyolysis 09/18/2017  . Asthma exacerbation 09/09/2017  . Acute respiratory failure with hypoxia [facility](HCC)      Current Outpatient Medications on File Prior to Visit  Medication Sig Dispense Refill  . acetaminophen (TYLENOL) 500 MG tablet Take 2 tablets (1,000 mg total) by mouth every 8 (eight) hours as needed for moderate pain or fever. 30 tablet 0   No  current facility-administered medications on file prior to visit.     No Known Allergies  Social History   Socioeconomic History  . Marital status: Single    Spouse name: Not on file  . Number of children: Not on file  . Years of education: Not on file  . Highest education level: Not on file  Occupational History  . Not on file  Social Needs  . Financial resource strain: Not on file  . Food insecurity:    Worry: Not on file    Inability: Not on file  . Transportation needs:    Medical: Not on file    Non-medical: Not on file  Tobacco Use  . Smoking status: Former Smoker    Packs/day: 0.50    Years: 2.00    Pack years: 1.00  . Smokeless tobacco: Never Used  Substance and Sexual Activity  . Alcohol use: Yes    Comment: rarely   . Drug use: Yes    Types: Marijuana  . Sexual activity: Yes    Birth control/protection: Condom  Lifestyle  . Physical activity:    Days per week: Not on file    Minutes per session: Not on file  . Stress: Not on file  Relationships  . Social connections:    Talks on phone: Not on file    Gets together: Not on file    Attends religious service: Not on file    Active member of club or organization: Not on file    Attends meetings of clubs or  organizations: Not on file    Relationship status: Not on file  . Intimate partner violence:    Fear of current or ex partner: Not on file    Emotionally abused: Not on file    Physically abused: Not on file    Forced sexual activity: Not on file  Other Topics Concern  . Not on file  Social History Narrative  . Not on file    No family history on file.  No past surgical history on file.  ROS: Review of Systems Negative except as stated above PHYSICAL EXAM: BP (!) 150/93   Pulse 67   Temp 98.7 F (37.1 C) (Oral)   Resp 16   Wt 144 lb 9.6 oz (65.6 kg)   SpO2 99%   BMI 22.64 kg/m   Repeat BP 140/94 Physical Exam  General appearance - alert, well appearing, young African-American  male and in no distress Mental status - normal mood, behavior, speech, dress, motor activity, and thought processes Neck - supple, no significant adenopathy Chest - clear to auscultation, no wheezes, rales or rhonchi, symmetric air entry Heart - normal rate, regular rhythm, normal S1, S2, no murmurs, rubs, clicks or gallops   ASSESSMENT AND PLAN: 1. Elevated blood pressure reading Advised on DASH diet.  I have asked him to check blood pressure about once a week at any local pharmacy and record readings.  I will see him back in 1 month for recheck - Comprehensive metabolic panel  2. Mild intermittent asthma, unspecified whether complicated  - albuterol (PROVENTIL HFA;VENTOLIN HFA) 108 (90 Base) MCG/ACT inhaler; Inhale 1-2 puffs into the lungs every 6 (six) hours as needed for wheezing or shortness of breath.  Dispense: 1 Inhaler; Refill: 6  3. Former smoker Commended him on quitting.  Encouraged him to remain free of cigarettes  4. History of substance use Commended him on quitting.  Encourage him to abstain   Patient was given the opportunity to ask questions.  Patient verbalized understanding of the plan and was able to repeat key elements of the plan.   Orders Placed This Encounter  Procedures  . Comprehensive metabolic panel     Requested Prescriptions   Signed Prescriptions Disp Refills  . albuterol (PROVENTIL HFA;VENTOLIN HFA) 108 (90 Base) MCG/ACT inhaler 1 Inhaler 6    Sig: Inhale 1-2 puffs into the lungs every 6 (six) hours as needed for wheezing or shortness of breath.    Return in about 1 month (around 11/10/2017) for blood pressure check.  Jeremy Blue, MD, FACP

## 2017-10-11 NOTE — Patient Instructions (Signed)
Cut back on salt in foods.  DASH Eating Plan DASH stands for "Dietary Approaches to Stop Hypertension." The DASH eating plan is a healthy eating plan that has been shown to reduce high blood pressure (hypertension). It may also reduce your risk for type 2 diabetes, heart disease, and stroke. The DASH eating plan may also help with weight loss. What are tips for following this plan? General guidelines  Avoid eating more than 2,300 mg (milligrams) of salt (sodium) a day. If you have hypertension, you may need to reduce your sodium intake to 1,500 mg a day.  Limit alcohol intake to no more than 1 drink a day for nonpregnant women and 2 drinks a day for men. One drink equals 12 oz of beer, 5 oz of wine, or 1 oz of hard liquor.  Work with your health care provider to maintain a healthy body weight or to lose weight. Ask what an ideal weight is for you.  Get at least 30 minutes of exercise that causes your heart to beat faster (aerobic exercise) most days of the week. Activities may include walking, swimming, or biking.  Work with your health care provider or diet and nutrition specialist (dietitian) to adjust your eating plan to your individual calorie needs. Reading food labels  Check food labels for the amount of sodium per serving. Choose foods with less than 5 percent of the Daily Value of sodium. Generally, foods with less than 300 mg of sodium per serving fit into this eating plan.  To find whole grains, look for the word "whole" as the first word in the ingredient list. Shopping  Buy products labeled as "low-sodium" or "no salt added."  Buy fresh foods. Avoid canned foods and premade or frozen meals. Cooking  Avoid adding salt when cooking. Use salt-free seasonings or herbs instead of table salt or sea salt. Check with your health care provider or pharmacist before using salt substitutes.  Do not fry foods. Cook foods using healthy methods such as baking, boiling, grilling, and  broiling instead.  Cook with heart-healthy oils, such as olive, canola, soybean, or sunflower oil. Meal planning   Eat a balanced diet that includes: ? 5 or more servings of fruits and vegetables each day. At each meal, try to fill half of your plate with fruits and vegetables. ? Up to 6-8 servings of whole grains each day. ? Less than 6 oz of lean meat, poultry, or fish each day. A 3-oz serving of meat is about the same size as a deck of cards. One egg equals 1 oz. ? 2 servings of low-fat dairy each day. ? A serving of nuts, seeds, or beans 5 times each week. ? Heart-healthy fats. Healthy fats called Omega-3 fatty acids are found in foods such as flaxseeds and coldwater fish, like sardines, salmon, and mackerel.  Limit how much you eat of the following: ? Canned or prepackaged foods. ? Food that is high in trans fat, such as fried foods. ? Food that is high in saturated fat, such as fatty meat. ? Sweets, desserts, sugary drinks, and other foods with added sugar. ? Full-fat dairy products.  Do not salt foods before eating.  Try to eat at least 2 vegetarian meals each week.  Eat more home-cooked food and less restaurant, buffet, and fast food.  When eating at a restaurant, ask that your food be prepared with less salt or no salt, if possible. What foods are recommended? The items listed may not be a complete  list. Talk with your dietitian about what dietary choices are best for you. Grains Whole-grain or whole-wheat bread. Whole-grain or whole-wheat pasta. Brown rice. [place]. Bulgur. Whole-grain and low-sodium cereals. Pita bread. Low-fat, low-sodium crackers. Whole-wheat flour tortillas. Vegetables Fresh or frozen vegetables (raw, steamed, roasted, or grilled). Low-sodium or reduced-sodium tomato and vegetable juice. Low-sodium or reduced-sodium tomato sauce and tomato paste. Low-sodium or reduced-sodium canned vegetables. Fruits All fresh, dried, or frozen fruit. Canned  fruit in natural juice (without added sugar). Meat and other protein foods Skinless chicken or [place]. Ground chicken or [place]. Pork with fat trimmed off. Fish and seafood. Egg whites. Dried beans, peas, or lentils. Unsalted nuts, nut butters, and seeds. Unsalted canned beans. Lean cuts of beef with fat trimmed off. Low-sodium, lean deli meat. Dairy Low-fat (1%) or fat-free (skim) milk. Fat-free, low-fat, or reduced-fat cheeses. Nonfat, low-sodium ricotta or cottage cheese. Low-fat or nonfat yogurt. Low-fat, low-sodium cheese. Fats and oils Soft margarine without trans fats. Vegetable oil. Low-fat, reduced-fat, or light mayonnaise and salad dressings (reduced-sodium). Canola, safflower, olive, soybean, and sunflower oils. Avocado. Seasoning and other foods Herbs. Spices. Seasoning mixes without salt. Unsalted popcorn and pretzels. Fat-free sweets. What foods are not recommended? The items listed may not be a complete list. Talk with your dietitian about what dietary choices are best for you. Grains Baked goods made with fat, such as croissants, muffins, or some breads. Dry pasta or rice meal packs. Vegetables Creamed or fried vegetables. Vegetables in a cheese sauce. Regular canned vegetables (not low-sodium or reduced-sodium). Regular canned tomato sauce and paste (not low-sodium or reduced-sodium). Regular tomato and vegetable juice (not low-sodium or reduced-sodium). Angie Fava. Olives. Fruits Canned fruit in a light or heavy syrup. Fried fruit. Fruit in cream or butter sauce. Meat and other protein foods Fatty cuts of meat. Ribs. Fried meat. Berniece Salines. Sausage. Bologna and other processed lunch meats. Salami. Fatback. Hotdogs. Bratwurst. Salted nuts and seeds. Canned beans with added salt. Canned or smoked fish. Whole eggs or egg yolks. Chicken or [place] with skin. Dairy Whole or 2% milk, cream, and half-and-half. Whole or full-fat cream cheese. Whole-fat or sweetened yogurt. Full-fat cheese.  Nondairy creamers. Whipped toppings. Processed cheese and cheese spreads. Fats and oils Butter. Stick margarine. Lard. Shortening. Ghee. Bacon fat. Tropical oils, such as coconut, palm kernel, or palm oil. Seasoning and other foods Salted popcorn and pretzels. Onion salt, garlic salt, seasoned salt, table salt, and sea salt. Worcestershire sauce. Tartar sauce. Barbecue sauce. Teriyaki sauce. Soy sauce, including reduced-sodium. Steak sauce. Canned and packaged gravies. Fish sauce. Oyster sauce. Cocktail sauce. Horseradish that you find on the shelf. Ketchup. Mustard. Meat flavorings and tenderizers. Bouillon cubes. Hot sauce and Tabasco sauce. Premade or packaged marinades. Premade or packaged taco seasonings. Relishes. Regular salad dressings. Where to find more information:  National Heart, Lung, and [place]: https://wilson-eaton.com/  American Heart Association: www.heart.org Summary  The DASH eating plan is a healthy eating plan that has been shown to reduce high blood pressure (hypertension). It may also reduce your risk for type 2 diabetes, heart disease, and stroke.  With the DASH eating plan, you should limit salt (sodium) intake to 2,300 mg a day. If you have hypertension, you may need to reduce your sodium intake to 1,500 mg a day.  When on the DASH eating plan, aim to eat more fresh fruits and vegetables, whole grains, lean proteins, low-fat dairy, and heart-healthy fats.  Work with your health care provider or diet and nutrition specialist (dietitian) to  adjust your eating plan to your individual calorie needs. This information is not intended to replace advice given to you by your health care provider. Make sure you discuss any questions you have with your health care provider. Document Released: 04/12/2011 Document Revised: 04/16/2016 Document Reviewed: 04/16/2016 Elsevier Interactive Patient Education  [employer].

## 2017-10-12 LAB — COMPREHENSIVE METABOLIC PANEL
ALT: 18 IU/L (ref 0–44)
AST: 17 IU/L (ref 0–40)
Albumin/Globulin Ratio: 1.5 (ref 1.2–2.2)
Albumin: 4.3 g/dL (ref 3.5–5.5)
Alkaline Phosphatase: 52 IU/L (ref 39–117)
BUN/Creatinine Ratio: 9 (ref 9–20)
BUN: 11 mg/dL (ref 6–20)
Bilirubin Total: 0.6 mg/dL (ref 0.0–1.2)
CALCIUM: 10 mg/dL (ref 8.7–10.2)
CHLORIDE: 106 mmol/L (ref 96–106)
CO2: 24 mmol/L (ref 20–29)
Creatinine, Ser: 1.25 mg/dL (ref 0.76–1.27)
GFR, EST AFRICAN AMERICAN: 89 mL/min/{1.73_m2} (ref >59)
GFR, EST NON AFRICAN AMERICAN: 77 mL/min/{1.73_m2} (ref >59)
GLUCOSE: 75 mg/dL (ref 65–99)
Globulin, Total: 2.9 g/dL (ref 1.5–4.5)
POTASSIUM: 4.4 mmol/L (ref 3.5–5.2)
Sodium: 145 mmol/L — ABNORMAL HIGH (ref 134–144)
TOTAL PROTEIN: 7.2 g/dL (ref 6.0–8.5)

## 2017-12-02 ENCOUNTER — Ambulatory Visit: Payer: Self-pay | Admitting: Internal Medicine

## 2018-12-21 IMAGING — CR DG RIBS W/ CHEST 3+V*L*
3 series · 3 of 3 positions shown · non-contrast
Comparison: 12/20/2014 CXR

CLINICAL DATA: Left rib pain.  Patient fell 2 weeks ago.

EXAM:
LEFT RIBS AND CHEST - 3+ VIEW

[w chest pa (1 of 2)]
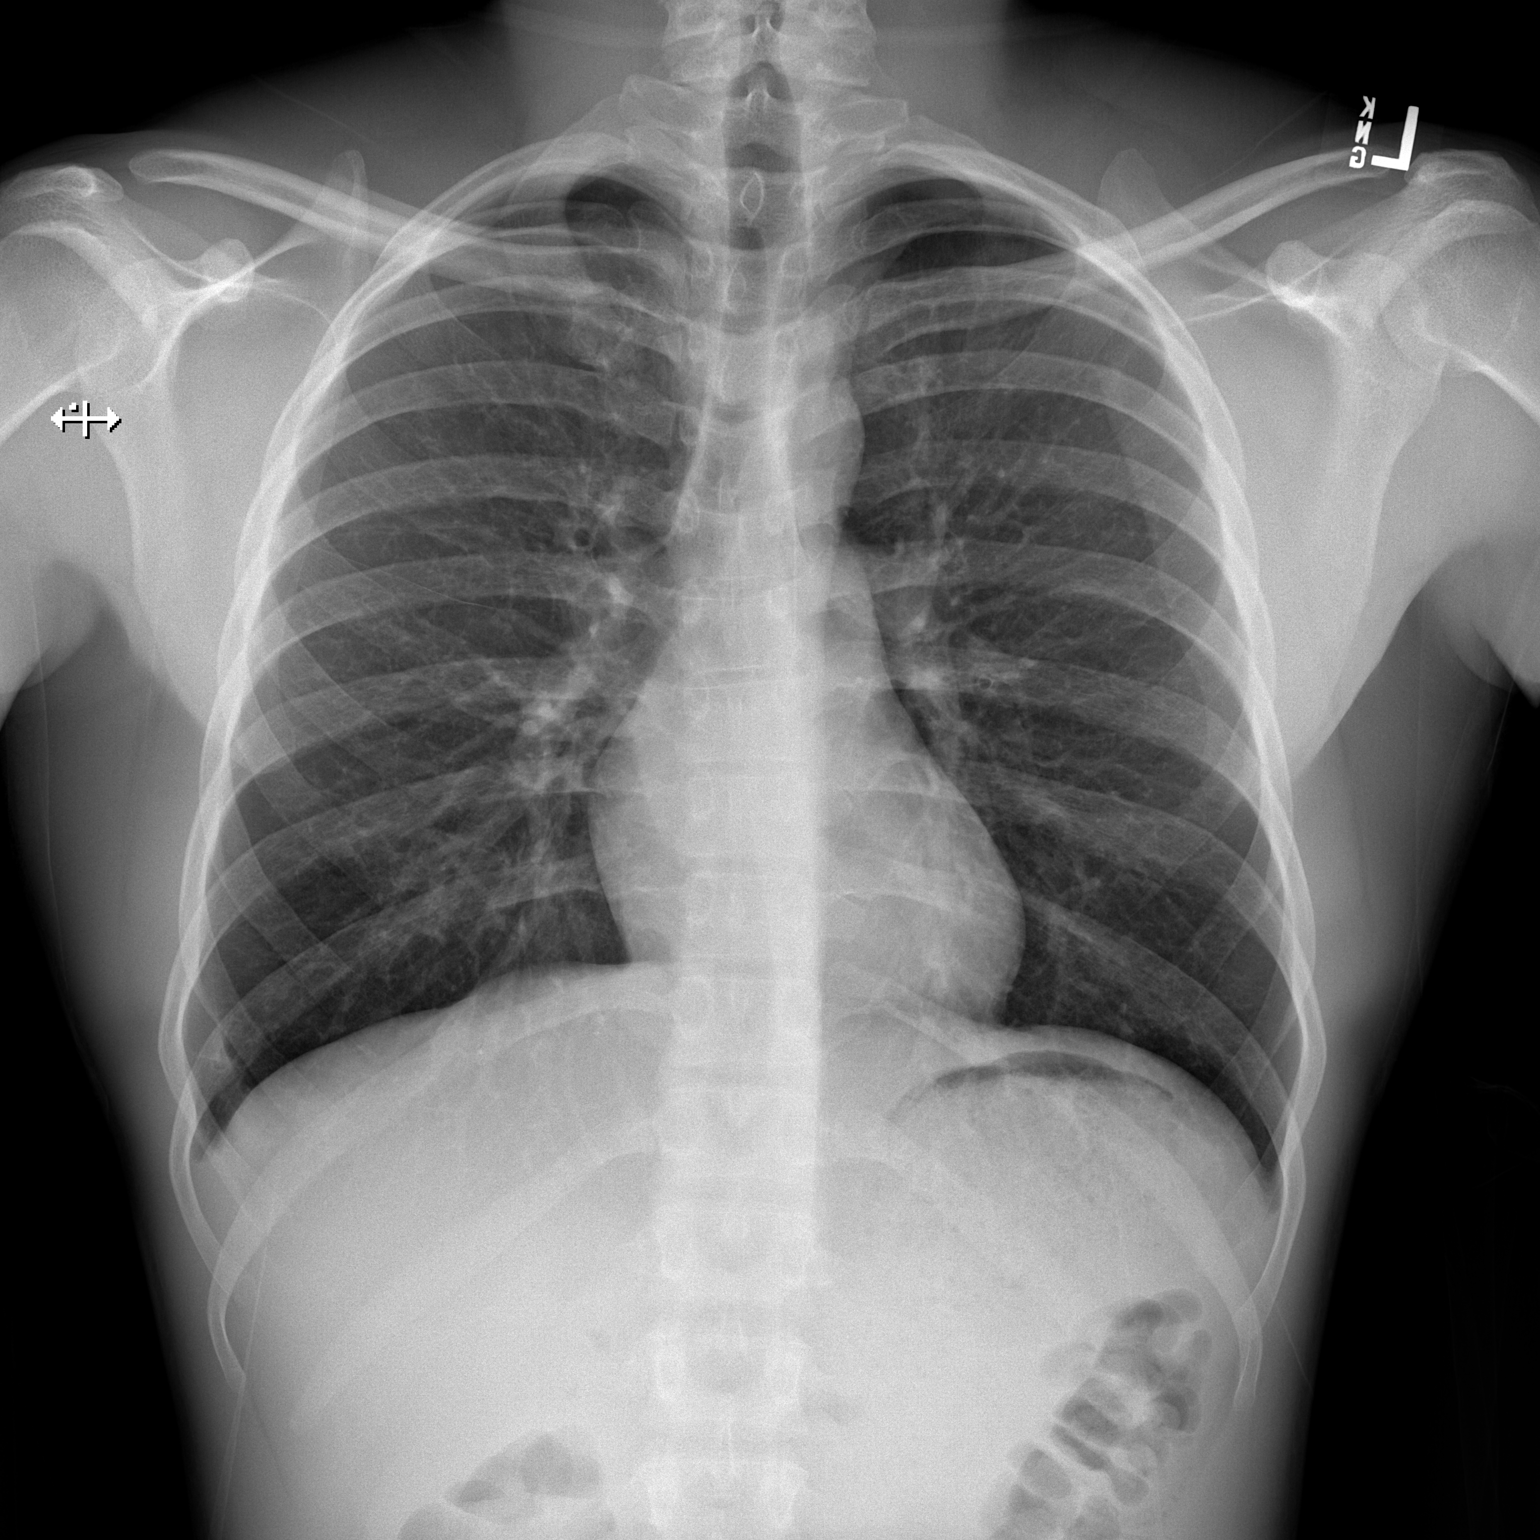

[w chest pa (2 of 2)]
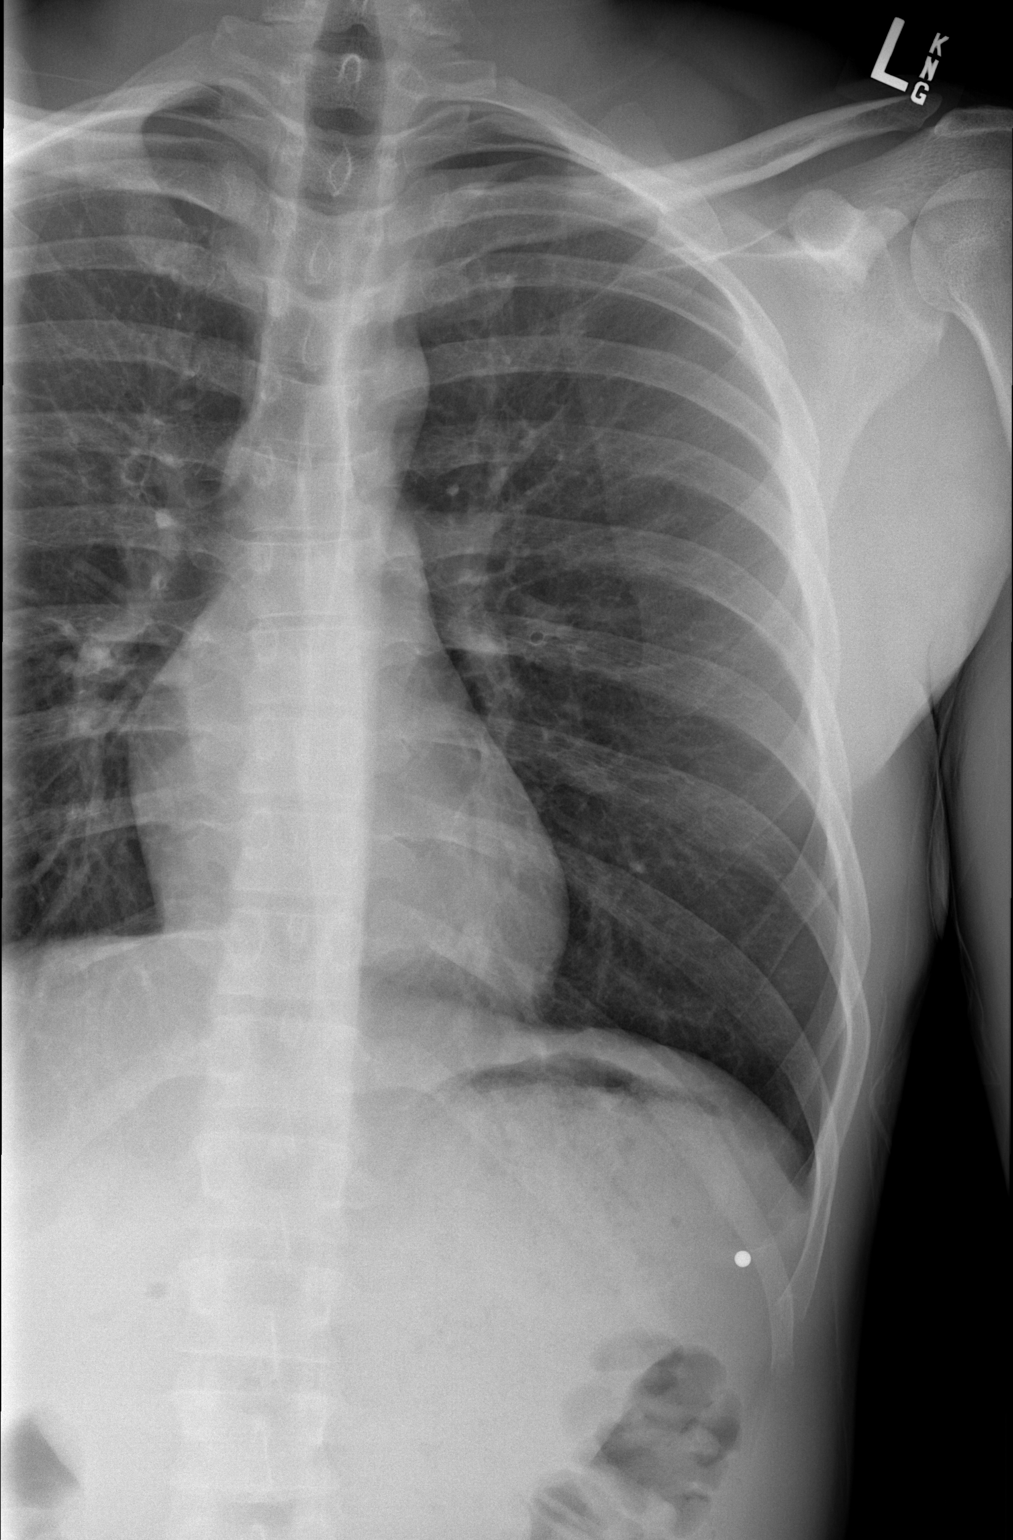

[w ribs obl left]
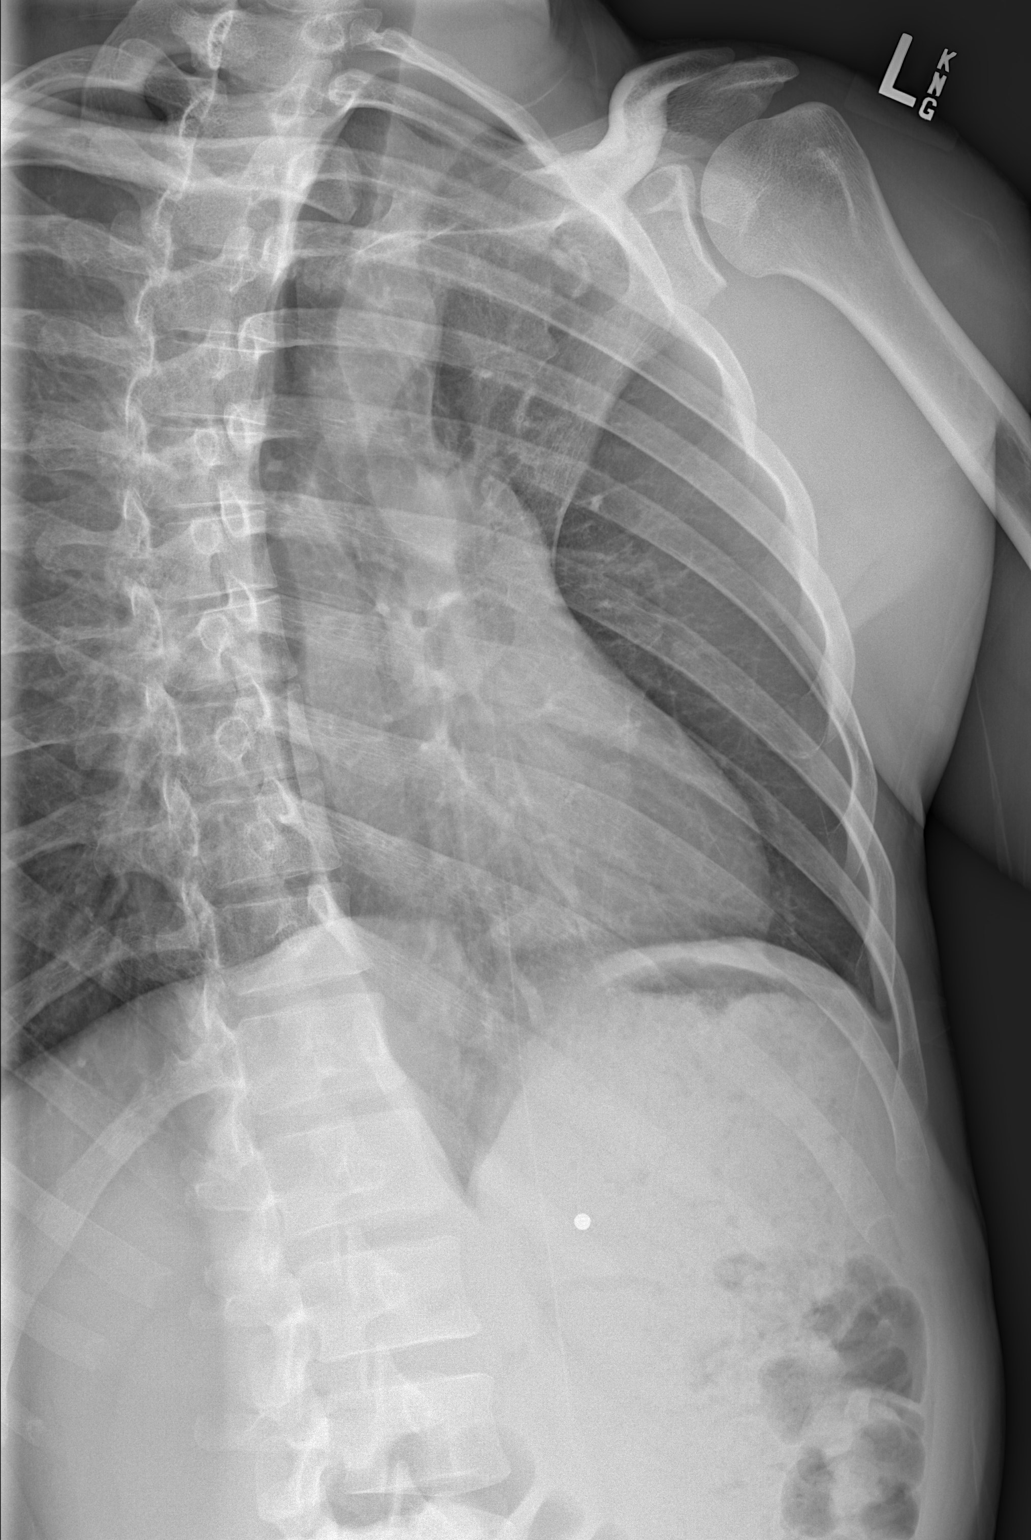

[3 of 3 positions shown; findings below may reference images not displayed]

FINDINGS: No fracture or other bone lesions are seen involving the ribs. A BB
was placed over the area pain which projects adjacent to the
anterior left eleventh rib. No acute abnormality is seen. There is
no evidence of pneumothorax or pleural effusion. Both lungs are
clear. Heart size and mediastinal contours are within normal limits.
IMPRESSION: No acute displaced rib fracture.

## 2020-04-08 IMAGING — DX DG ABD PORTABLE 1V
2 series · 2 of 2 positions shown · non-contrast
Comparison: Portable exam 2629 hours without priors for comparison

CLINICAL DATA: Vomiting

EXAM:
PORTABLE ABDOMEN - 1 VIEW

[abdomen kub (1 of 2)]
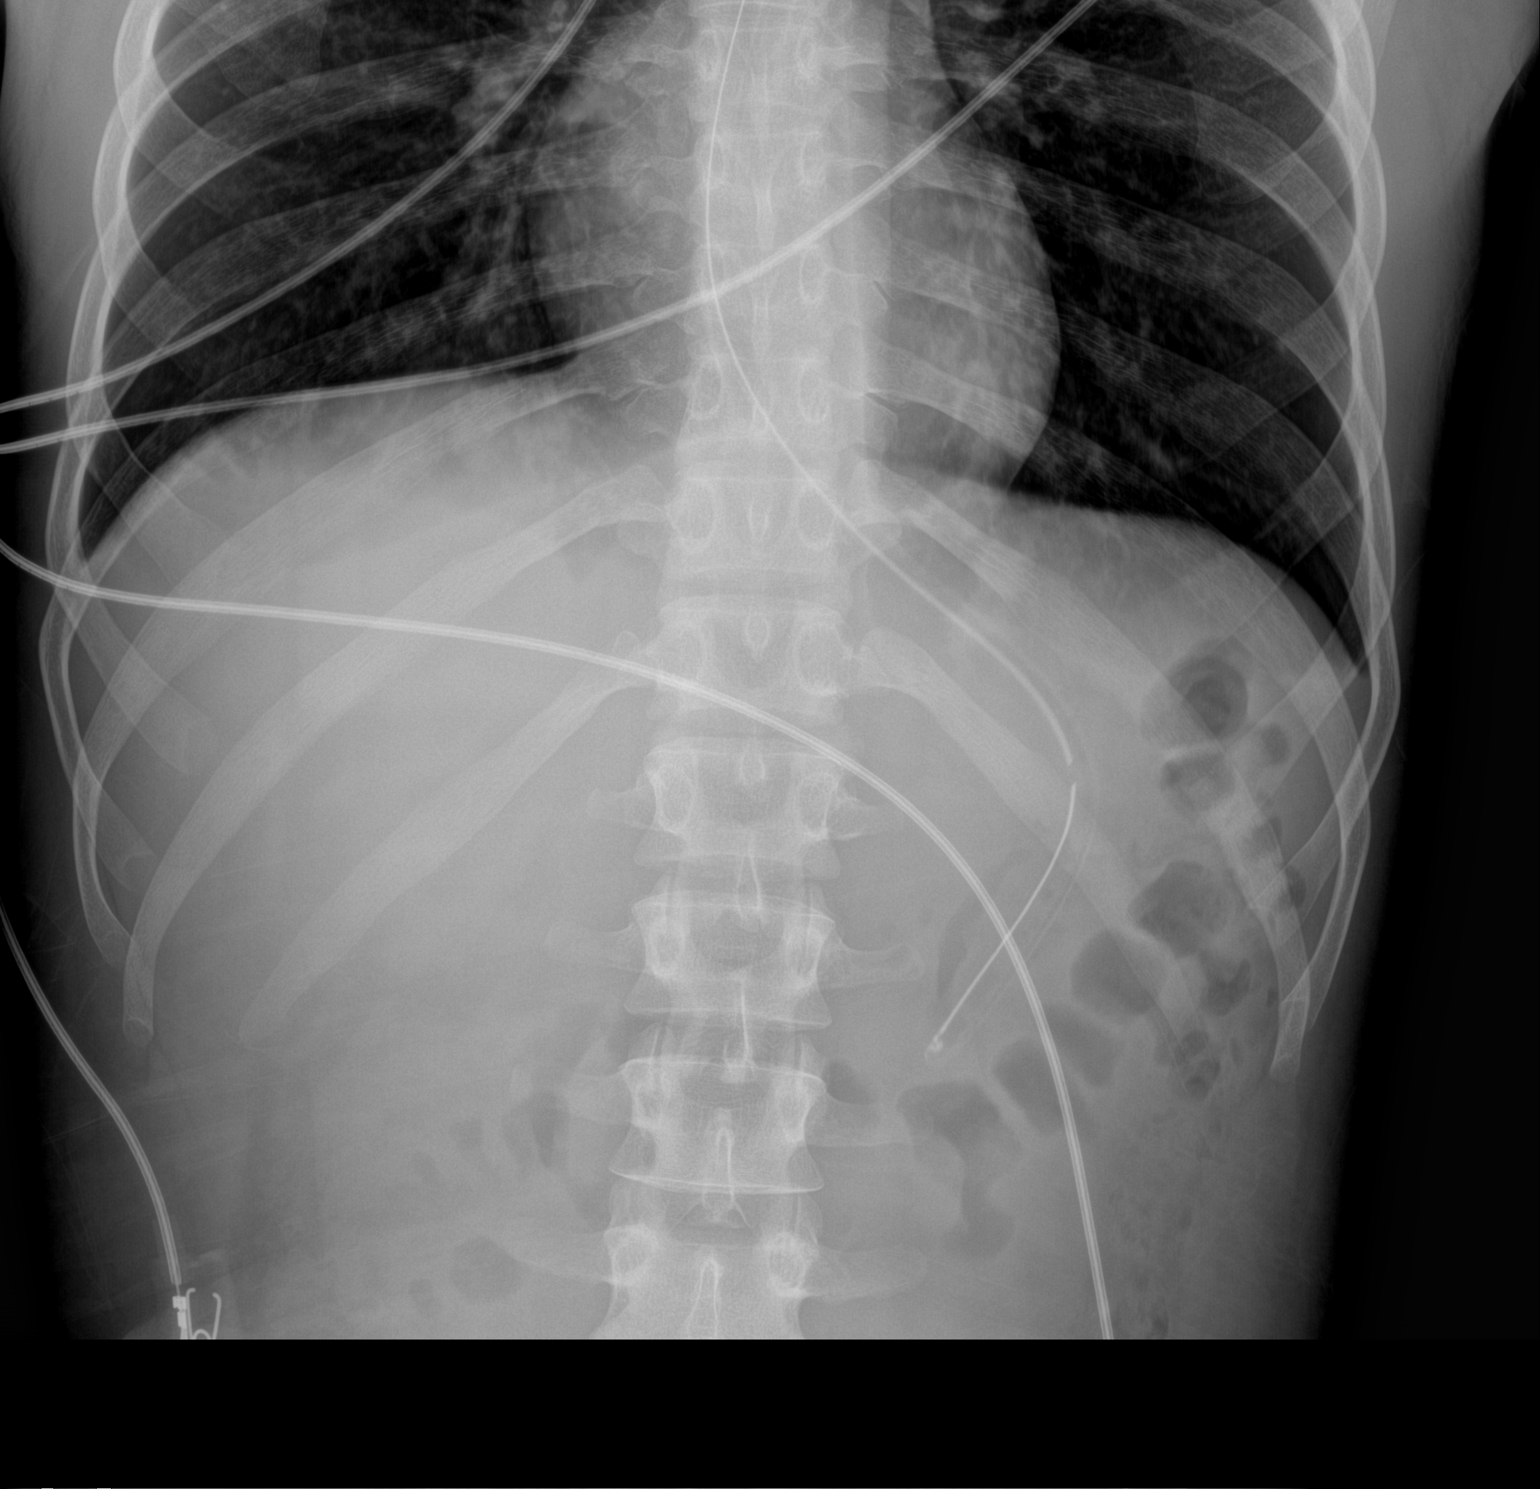

[abdomen kub (2 of 2)]
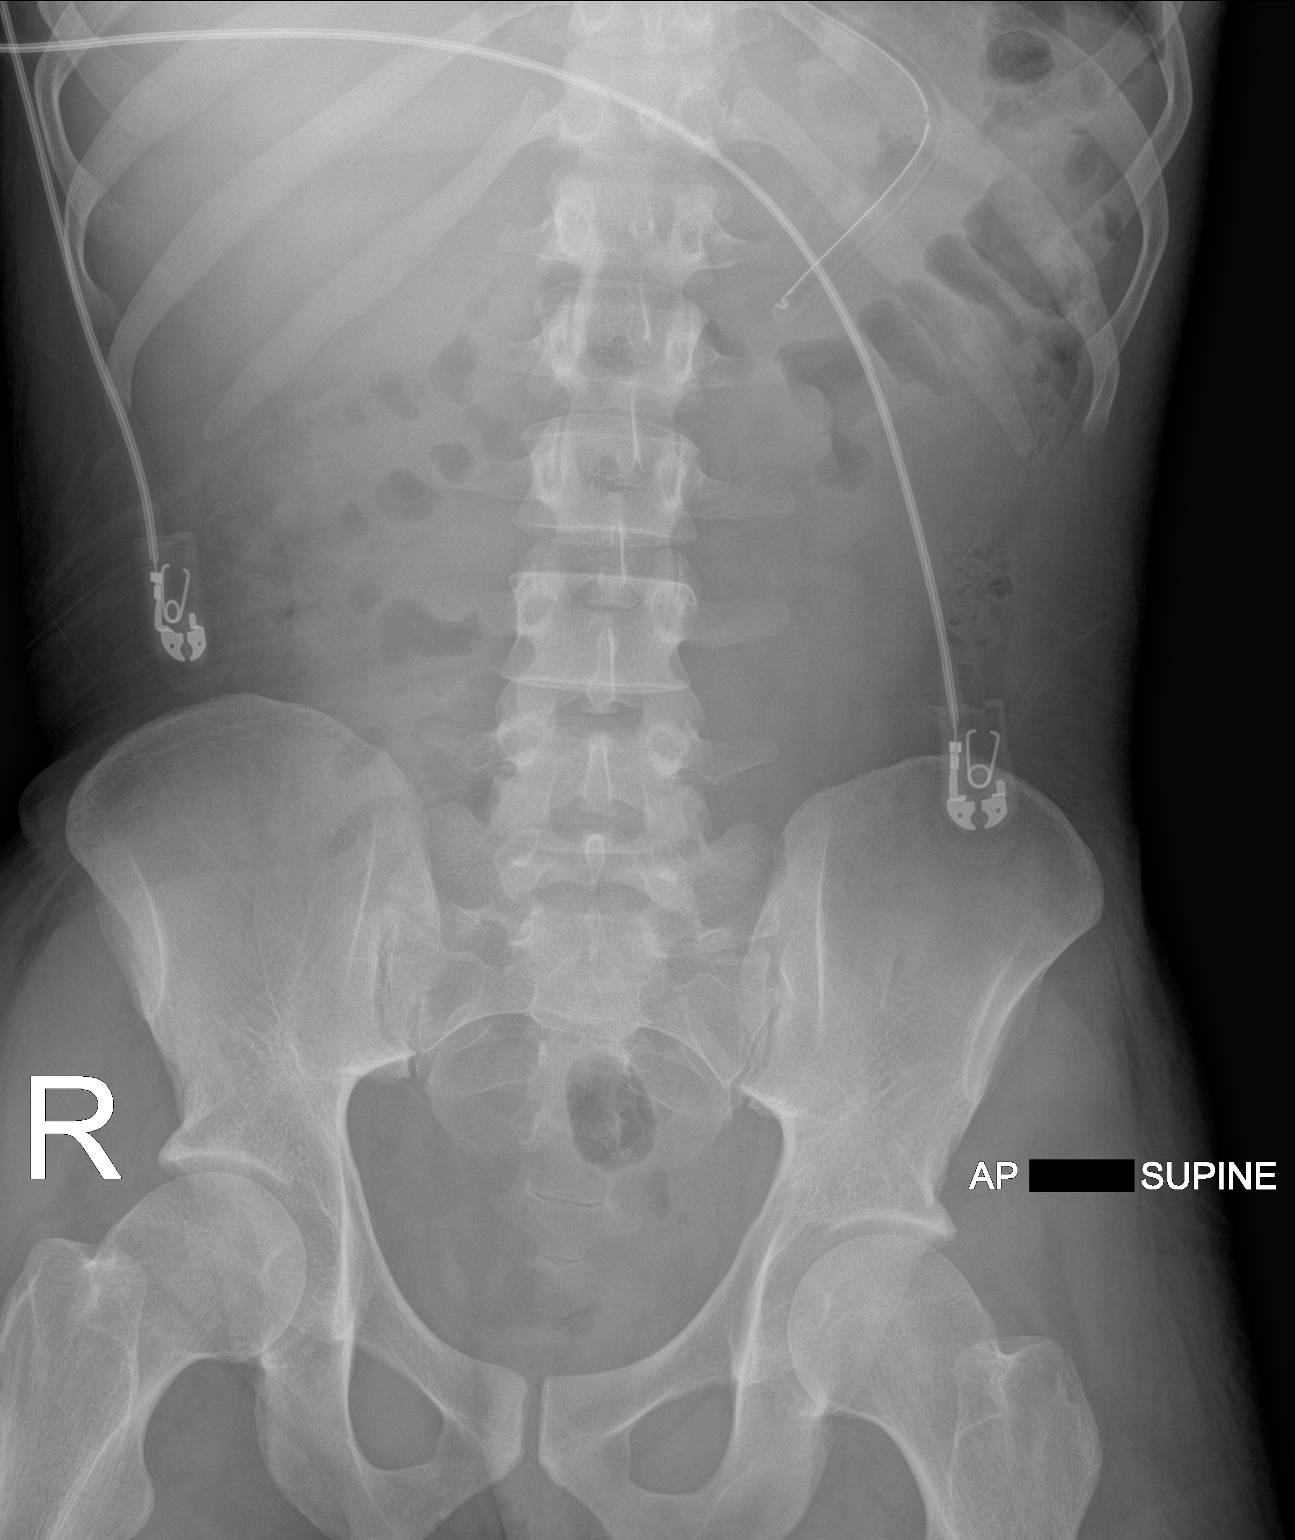

[2 of 2 positions shown; findings below may reference images not displayed]

FINDINGS: Tip of nasogastric tube projects over mid stomach.

Nonobstructive bowel gas pattern.

No bowel dilatation or bowel wall thickening.

Osseous structures unremarkable.

Lung bases clear.

No urinary tract calcification.
IMPRESSION: Normal exam.

## 2021-12-26 ENCOUNTER — Emergency Department (HOSPITAL_COMMUNITY)
Admission: EM | Admit: 2021-12-26 | Discharge: 2022-01-05 | Disposition: E | Payer: Self-pay | Attending: Emergency Medicine | Admitting: Emergency Medicine

## 2021-12-26 DIAGNOSIS — I469 Cardiac arrest, cause unspecified: Secondary | ICD-10-CM | POA: Insufficient documentation

## 2021-12-26 DIAGNOSIS — R0681 Apnea, not elsewhere classified: Secondary | ICD-10-CM | POA: Insufficient documentation

## 2021-12-26 MED ORDER — SODIUM BICARBONATE 8.4 % IV SOLN
INTRAVENOUS | Status: AC | PRN
  Administered 2021-12-26: 50 meq via INTRAVENOUS

## 2021-12-26 MED ORDER — CALCIUM CHLORIDE 10 % IV SOLN
INTRAVENOUS | Status: AC | PRN
  Administered 2021-12-26: 1 g via INTRAVENOUS

## 2021-12-26 MED ORDER — EPINEPHRINE 1 MG/10ML IJ SOSY
PREFILLED_SYRINGE | INTRAMUSCULAR | Status: AC | PRN
  Administered 2021-12-26 (×2): 1 mg via INTRAVENOUS

## 2021-12-26 MED ORDER — MAGNESIUM SULFATE 50 % IJ SOLN
INTRAMUSCULAR | Status: AC | PRN
  Administered 2021-12-26: 2 g via INTRAVENOUS

## 2022-01-05 NOTE — ED Provider Notes (Addendum)
[facility] EMERGENCY DEPARTMENT Provider Note   CSN: [id number] Arrival date & time: 01/14/22  9678     History  Chief Complaint  Patient presents with   Cardiac Arrest    Jeremy Mccormick is a 36 y.o. male.  Jeremy Mccormick, unknown age, presenting to ED for cardiac arrest with CPR in progress.  EMS reports patient was dropped off in front of fire station by friends. Patient was reported to have staggered out of car then fell to the ground, pulseless and apneic at 1843. Patient received immediate bystander CPR, multiple rounds of epinephrine, with asystole on the monitor at pulse checks. Fire was told pt had a hx of asthma and used crack cocaine prior to being dropped off. On arrival patient has an ET tube in place. GCS 13, fixed and dilated pupils, with asystole on the monitor.   The history is provided by the patient. No language interpreter was used.  Cardiac Arrest      Home Medications Prior to Admission medications   Not on File      Allergies    Patient has no allergy information on record.    Review of Systems   Review of Systems  Unable to perform ROS: Patient unresponsive    Physical Exam Updated Vital Signs BP (!) 0/0   Pulse (!) 0   Resp (!) 0   Ht 6' (1.829 m) Comment: estimated  Wt 86.2 kg   SpO2 (!) 0%   BMI 25.77 kg/m  Physical Exam Vitals and nursing note reviewed.  HENT:     Head: Normocephalic and atraumatic.     Nose: Nose normal.     Mouth/Throat:     Comments: ET tube in place Eyes:     Comments: Pupils fixed and dilated on arrival   Cardiovascular:     Comments: No heart tones Pulmonary:     Comments: No spontaneous breaths. Breath sounds equal bilaterally, diminished during bagging, but clear.  Chest:     Chest wall: No mass, lacerations or crepitus.  Abdominal:     General: Abdomen is flat.     Palpations: Abdomen is soft.  Skin:    Findings: No rash.  Neurological:     Mental Status: He is unresponsive.     GCS:  GCS eye subscore is 1. GCS verbal subscore is 1. GCS motor subscore is 1.     ED Results / Procedures / Treatments   Labs (all labs ordered are listed, but only abnormal results are displayed) Labs Reviewed - No data to display  EKG None  Radiology No results found.  Procedures .Critical Care  Performed by: Franne Forts, DO Authorized by: Franne Forts, DO   Critical care provider statement:    Critical care time (minutes):  100   Critical care was necessary to treat or prevent imminent or life-threatening deterioration of the following conditions:  Cardiac failure and respiratory failure   Critical care was time spent personally by me on the following activities:  Development of treatment plan with patient or surrogate, discussions with consultants, evaluation of patient's response to treatment, examination of patient, ordering and review of laboratory studies, ordering and review of radiographic studies, ordering and performing treatments and interventions, pulse oximetry, re-evaluation of patient's condition and review of old charts     Medications Ordered in ED Medications  EPINEPHrine (ADRENALIN) 1 MG/10ML injection (1 mg Intravenous Given 01-14-2022 1927)  calcium chloride injection (1 g Intravenous Given 01/14/2022 1922)  sodium bicarbonate injection (50 mEq Intravenous Given Jan 22, 2022 1923)  magnesium sulfate (IV Push/IM) injection (2 g Intravenous Given January 22, 2022 1925)    ED Course/ Medical Decision Making/ A&P                           Medical Decision Making Risk Prescription drug management.   Patient presents in arrest. Estimated downtime reported by EMS was approx 40 min prior to arrival. Epi was given in the field. Pt taken to resus room and ACLS protocol was continued. Refer to ED code summary for full details on medications and procedures. Asystole was confirmed by 3 lead EKG and/or bedside US. Cause of death at this time is unknown. Likely cause of death is  cardiac arrest secondary to crack cocaine use versus cardiac arrest from hypoxia and apnea.  Pt is a Counsellor. No contact information or pcp known at this time.   Patient identified by the police. Jeremy Mccormick. This is a Adult nurse case. Patient's child's mother in the family room. Notified of death. Patient's brother also on the phone, currently residing in [place], up to date. Mother died last year.            Final Clinical Impression(s) / ED Diagnoses Final diagnoses:  Cardiac arrest [facility])  Apnea  Asystole [facility])    Rx / DC Orders ED Discharge Orders     None         Franne Forts, DO January 22, 2022 2357    Franne Forts, DO 01/22/2022 2358

## 2022-01-05 NOTE — Progress Notes (Signed)
While rounding in the ED Chaplain was notified of a male visitor in the family waiting area who wanted to see her friend.   Because she was not listed as next of kin the attending physician was asked to give death notification.  Chaplain attended pt's physician and pt's nurse, offering emotional support and prayers.  Learning the lady hadn't eaten today, Chaplain brought food and drink to the family waiting area.    Vernell Morgans Chaplain

## 2022-01-05 NOTE — ED Triage Notes (Signed)
Pt arrives via EMS ongoing CPR.   Per EMS report pt was dropped of at he fire station by friends. Friends reported "Crack" use earlier and hx asthma. Pt was alerted coming out of car, stumbled, and went unresponsive.  Fire initiated CPR at [employer]. CPR ongoing on arrival. Rhythms Asystole and PEA. Epi x5.

## 2022-01-05 NOTE — Code Documentation (Addendum)
Patient time of death occurred at 26. Confirmed via bedside ultrasound

## 2022-01-05 DEATH — deceased
